# Patient Record
Sex: Female | Born: 1990
Health system: Southern US, Community
[De-identification: ages and names within clinical notes are randomized; demographics above are authoritative.]

## PROBLEM LIST (undated history)

## (undated) DIAGNOSIS — F32A Depression, unspecified: Secondary | ICD-10-CM

## (undated) DIAGNOSIS — I10 Essential (primary) hypertension: Secondary | ICD-10-CM

## (undated) DIAGNOSIS — F419 Anxiety disorder, unspecified: Secondary | ICD-10-CM

## (undated) HISTORY — DX: Essential (primary) hypertension: I10

## (undated) HISTORY — DX: Anxiety disorder, unspecified: F41.9

## (undated) HISTORY — DX: Depression, unspecified: F32.A

---
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
## 2018-03-31 DIAGNOSIS — F418 Other specified anxiety disorders: Secondary | ICD-10-CM | POA: Insufficient documentation

## 2019-12-06 ENCOUNTER — Emergency Department (HOSPITAL_COMMUNITY)
Admission: EM | Admit: 2019-12-06 | Discharge: 2019-12-06 | Disposition: A | Discharge status: Home / Self Care | Payer: Medicaid - Out of State | Attending: Emergency Medicine | Admitting: Emergency Medicine

## 2019-12-06 ENCOUNTER — Encounter (HOSPITAL_COMMUNITY): Payer: Self-pay | Admitting: Emergency Medicine

## 2019-12-06 ENCOUNTER — Other Ambulatory Visit: Payer: Self-pay

## 2019-12-06 ENCOUNTER — Emergency Department (HOSPITAL_COMMUNITY): Payer: Medicaid - Out of State

## 2019-12-06 DIAGNOSIS — R1013 Epigastric pain: Secondary | ICD-10-CM | POA: Diagnosis not present

## 2019-12-06 DIAGNOSIS — R1110 Vomiting, unspecified: Secondary | ICD-10-CM | POA: Insufficient documentation

## 2019-12-06 LAB — COMPREHENSIVE METABOLIC PANEL
ALT: 27 U/L (ref 0–44)
AST: 40 U/L (ref 15–41)
Albumin: 3.8 g/dL (ref 3.5–5.0)
Alkaline Phosphatase: 58 U/L (ref 38–126)
Anion gap: 11 (ref 5–15)
BUN: 14 mg/dL (ref 6–20)
CO2: 24 mmol/L (ref 22–32)
Calcium: 9 mg/dL (ref 8.9–10.3)
Chloride: 102 mmol/L (ref 98–111)
Creatinine, Ser: 0.92 mg/dL (ref 0.44–1.00)
GFR calc Af Amer: >60 mL/min (ref >60)
GFR calc non Af Amer: >60 mL/min (ref >60)
Glucose, Bld: 104 mg/dL — ABNORMAL HIGH (ref 70–99)
Potassium: 3.7 mmol/L (ref 3.5–5.1)
Sodium: 137 mmol/L (ref 135–145)
Total Bilirubin: 0.8 mg/dL (ref 0.3–1.2)
Total Protein: 6.8 g/dL (ref 6.5–8.1)

## 2019-12-06 LAB — URINALYSIS, ROUTINE W REFLEX MICROSCOPIC
Bilirubin Urine: NEGATIVE
Glucose, UA: NEGATIVE mg/dL
Hgb urine dipstick: NEGATIVE
Ketones, ur: NEGATIVE mg/dL
Leukocytes,Ua: NEGATIVE
Nitrite: NEGATIVE
Protein, ur: NEGATIVE mg/dL
Specific Gravity, Urine: 1.038 — ABNORMAL HIGH (ref 1.005–1.030)
pH: 7 (ref 5.0–8.0)

## 2019-12-06 LAB — CBC WITH DIFFERENTIAL/PLATELET
Abs Immature Granulocytes: 0.04 10*3/uL (ref 0.00–0.07)
Basophils Absolute: 0 10*3/uL (ref 0.0–0.1)
Basophils Relative: 0 %
Eosinophils Absolute: 0.1 10*3/uL (ref 0.0–0.5)
Eosinophils Relative: 1 %
HCT: 43.3 % (ref 36.0–46.0)
Hemoglobin: 14 g/dL (ref 12.0–15.0)
Immature Granulocytes: 0 %
Lymphocytes Relative: 22 %
Lymphs Abs: 2.2 10*3/uL (ref 0.7–4.0)
MCH: 30.4 pg (ref 26.0–34.0)
MCHC: 32.3 g/dL (ref 30.0–36.0)
MCV: 93.9 fL (ref 80.0–100.0)
Monocytes Absolute: 0.7 10*3/uL (ref 0.1–1.0)
Monocytes Relative: 7 %
Neutro Abs: 7 10*3/uL (ref 1.7–7.7)
Neutrophils Relative %: 70 %
Platelets: 260 10*3/uL (ref 150–400)
RBC: 4.61 MIL/uL (ref 3.87–5.11)
RDW: 13 % (ref 11.5–15.5)
WBC: 9.9 10*3/uL (ref 4.0–10.5)
nRBC: 0 % (ref 0.0–0.2)

## 2019-12-06 LAB — LIPASE, BLOOD: Lipase: 23 U/L (ref 11–51)

## 2019-12-06 LAB — HCG, QUANTITATIVE, PREGNANCY: hCG, Beta Chain, Quant, S: 1 m[IU]/mL (ref <5)

## 2019-12-06 MED ORDER — FENTANYL CITRATE (PF) 100 MCG/2ML IJ SOLN
50.0000 ug | Freq: Once | INTRAMUSCULAR | Status: AC
  Administered 2019-12-06: 50 ug via INTRAVENOUS
  Filled 2019-12-06: qty 2

## 2019-12-06 MED ORDER — FAMOTIDINE 20 MG PO TABS
20.0000 mg | ORAL_TABLET | Freq: Once | ORAL | Status: AC
  Administered 2019-12-06: 20 mg via ORAL
  Filled 2019-12-06: qty 1

## 2019-12-06 MED ORDER — LIDOCAINE VISCOUS HCL 2 % MT SOLN
15.0000 mL | Freq: Once | OROMUCOSAL | Status: AC
  Administered 2019-12-06: 15 mL via ORAL
  Filled 2019-12-06: qty 15

## 2019-12-06 MED ORDER — PANTOPRAZOLE SODIUM 20 MG PO TBEC: 20.0000 mg | DELAYED_RELEASE_TABLET | Freq: Every day | ORAL | 0 refills | Status: DC

## 2019-12-06 MED ORDER — FAMOTIDINE 20 MG PO TABS: 20.0000 mg | ORAL_TABLET | Freq: Two times a day (BID) | ORAL | 0 refills | Status: DC

## 2019-12-06 MED ORDER — ALUM & MAG HYDROXIDE-SIMETH 200-200-20 MG/5ML PO SUSP
30.0000 mL | Freq: Once | ORAL | Status: AC
  Administered 2019-12-06: 30 mL via ORAL
  Filled 2019-12-06: qty 30

## 2019-12-06 MED ORDER — LACTATED RINGERS IV BOLUS
1000.0000 mL | Freq: Once | INTRAVENOUS | Status: AC
  Administered 2019-12-06: 1000 mL via INTRAVENOUS

## 2019-12-06 MED ORDER — IOHEXOL 300 MG/ML  SOLN
100.0000 mL | Freq: Once | INTRAMUSCULAR | Status: AC
  Administered 2019-12-06: 100 mL via INTRAVENOUS

## 2019-12-06 MED ORDER — LIDOCAINE VISCOUS HCL 2 % MT SOLN: 15.0000 mL | OROMUCOSAL | 0 refills | Status: DC | PRN

## 2019-12-06 MED ORDER — PANTOPRAZOLE SODIUM 40 MG PO TBEC
80.0000 mg | DELAYED_RELEASE_TABLET | Freq: Once | ORAL | Status: AC
  Administered 2019-12-06: 80 mg via ORAL
  Filled 2019-12-06: qty 2

## 2019-12-06 NOTE — ED Provider Notes (Signed)
[facility] EMERGENCY DEPARTMENT Provider Note   CSN: [id number] Arrival date & time: 12/06/19  0404     History Chief Complaint  Patient presents with  . Abdominal Pain    Lindsay Sanders is a 29 y.o. female.   Abdominal Pain Pain location:  Epigastric Pain quality: aching, gnawing and sharp   Pain radiates to:  Back Pain severity:  Mild Onset quality:  Sudden Duration:  2 hours Timing:  Intermittent Progression:  Waxing and waning Chronicity:  New Context: not alcohol use   Relieved by:  None tried Worsened by:  Nothing Ineffective treatments:  None tried Associated symptoms: vomiting        History reviewed. No pertinent past medical history.  There are no problems to display for this patient.   History reviewed. No pertinent surgical history.   OB History   No obstetric history on file.     No family history on file.  Social History   Tobacco Use  . Smoking status: Never Smoker  . Smokeless tobacco: Never Used  Substance Use Topics  . Alcohol use: Never  . Drug use: Never    Home Medications Prior to Admission medications   Medication Sig Start Date End Date Taking? Authorizing Provider  famotidine (PEPCID) 20 MG tablet Take 1 tablet (20 mg total) by mouth 2 (two) times daily. 12/06/19   Vetta Couzens, Barbara Cower, MD  lidocaine (XYLOCAINE) 2 % solution Use as directed 15 mLs in the mouth or throat every 4 (four) hours as needed for mouth pain. 12/06/19   Graciano Batson, Barbara Cower, MD  pantoprazole (PROTONIX) 20 MG tablet Take 1 tablet (20 mg total) by mouth daily. 12/06/19   Taysia Rivere, Barbara Cower, MD    Allergies    Lexapro [escitalopram]  Review of Systems   Review of Systems  Gastrointestinal: Positive for abdominal pain and vomiting.  All other systems reviewed and are negative.   Physical Exam Updated Vital Signs BP 123/72 (BP Location: Right Arm)   Pulse 87   Temp 98.3 F (36.8 C) (Oral)   Resp 18   Ht 5\' 8"  (1.727 m)   Wt 120 kg   SpO2 97%    BMI 40.22 kg/m   Physical Exam Vitals and nursing note reviewed.  Constitutional:      Appearance: She is well-developed.  HENT:     Head: Normocephalic and atraumatic.     Nose: No congestion.     Mouth/Throat:     Mouth: Mucous membranes are moist.     Pharynx: Oropharynx is clear.  Eyes:     Conjunctiva/sclera: Conjunctivae normal.     Pupils: Pupils are equal, round, and reactive to light.  Cardiovascular:     Rate and Rhythm: Normal rate and regular rhythm.  Pulmonary:     Effort: Pulmonary effort is normal. No respiratory distress.     Breath sounds: No stridor.  Abdominal:     General: There is no distension.  Musculoskeletal:        General: No swelling. Normal range of motion.     Cervical back: Normal range of motion.  Skin:    General: Skin is warm and dry.  Neurological:     General: No focal deficit present.     Mental Status: She is alert.     ED Results / Procedures / Treatments   Labs (all labs ordered are listed, but only abnormal results are displayed) Labs Reviewed  COMPREHENSIVE METABOLIC PANEL - Abnormal; Notable for the following components:  Result Value   Glucose, Bld 104 (*)    All other components within normal limits  CBC WITH DIFFERENTIAL/PLATELET  LIPASE, BLOOD  HCG, QUANTITATIVE, PREGNANCY  URINALYSIS, ROUTINE W REFLEX MICROSCOPIC    EKG None  Radiology No results found.  Procedures Procedures (including critical care time)  Medications Ordered in ED Medications  pantoprazole (PROTONIX) EC tablet 80 mg (has no administration in time range)  famotidine (PEPCID) tablet 20 mg (has no administration in time range)  fentaNYL (SUBLIMAZE) injection 50 mcg (50 mcg Intravenous Given 12/06/19 0507)  lactated ringers bolus 1,000 mL (0 mLs Intravenous Stopped 12/06/19 0552)  alum & mag hydroxide-simeth (MAALOX/MYLANTA) 200-200-20 MG/5ML suspension 30 mL (30 mLs Oral Given 12/06/19 0601)    And  lidocaine (XYLOCAINE) 2 % viscous mouth  solution 15 mL (15 mLs Oral Given 12/06/19 0601)  iohexol (OMNIPAQUE) 300 MG/ML solution 100 mL (100 mLs Intravenous Contrast Given 12/06/19 0715)    ED Course  I have reviewed the triage vital signs and the nursing notes.  Pertinent labs & imaging results that were available during my care of the patient were reviewed by me and considered in my medical decision making (see chart for details).    MDM Rules/Calculators/A&P                      Epigastric pain. eval for pancreatitis, cholecystitis, cardiac causes.   Labs ok, will proceed with CT to rule out emergencies such as obstruction, colitis, enteritis or perforation.   Pain improved mostly with lidocaine. Fentanyl helped minimally, pending ct read, has no large abnormality on my perusal.   Ct ok. Stable for discharge with pcp follow up.   Final Clinical Impression(s) / ED Diagnoses Final diagnoses:  Epigastric pain    Rx / DC Orders ED Discharge Orders         Ordered    lidocaine (XYLOCAINE) 2 % solution  Every 4 hours PRN     12/06/19 0725    pantoprazole (PROTONIX) 20 MG tablet  Daily     12/06/19 0725    famotidine (PEPCID) 20 MG tablet  2 times daily     12/06/19 0725           Kellsie Grindle, Corene Cornea, MD 12/06/19 [phone]

## 2019-12-06 NOTE — ED Triage Notes (Signed)
Patient reports mid abdominal pain with emesis this morning , no diarrhea , denies fever or chills , patient added mild low back pain .

## 2019-12-06 NOTE — ED Notes (Signed)
Pt ambulated to restroom with steady gait and tolerated well. Able to give UA. Pt currently back in bed on monitors resting comfortably.

## 2019-12-11 ENCOUNTER — Telehealth: Payer: Self-pay | Admitting: Internal Medicine

## 2019-12-11 NOTE — Telephone Encounter (Signed)
[facility] REFERRAL

## 2019-12-12 NOTE — Telephone Encounter (Signed)
Ok to schedule ov.  

## 2019-12-12 NOTE — Telephone Encounter (Signed)
CALLED PATIENT AND L/M FOR HER TO CALL ME.  LOOKS LIKE WE MAY NOT TAKE HER INSURANCE

## 2020-01-02 ENCOUNTER — Ambulatory Visit: Payer: Medicaid - Out of State | Admitting: Gastroenterology

## 2020-02-01 DIAGNOSIS — F418 Other specified anxiety disorders: Secondary | ICD-10-CM | POA: Diagnosis not present

## 2020-05-16 DIAGNOSIS — Z23 Encounter for immunization: Secondary | ICD-10-CM | POA: Diagnosis not present

## 2020-05-16 DIAGNOSIS — Z6841 Body Mass Index (BMI) 40.0 and over, adult: Secondary | ICD-10-CM | POA: Diagnosis not present

## 2020-05-16 DIAGNOSIS — F418 Other specified anxiety disorders: Secondary | ICD-10-CM | POA: Diagnosis not present

## 2020-05-21 ENCOUNTER — Ambulatory Visit: Payer: Medicaid - Out of State

## 2020-05-31 ENCOUNTER — Other Ambulatory Visit: Payer: Self-pay | Admitting: Family Medicine

## 2020-06-13 DIAGNOSIS — L293 Anogenital pruritus, unspecified: Secondary | ICD-10-CM | POA: Diagnosis not present

## 2020-06-13 DIAGNOSIS — N760 Acute vaginitis: Secondary | ICD-10-CM | POA: Diagnosis not present

## 2020-06-13 DIAGNOSIS — Z113 Encounter for screening for infections with a predominantly sexual mode of transmission: Secondary | ICD-10-CM | POA: Diagnosis not present

## 2020-06-13 DIAGNOSIS — R350 Frequency of micturition: Secondary | ICD-10-CM | POA: Diagnosis not present

## 2020-07-18 DIAGNOSIS — Z30432 Encounter for removal of intrauterine contraceptive device: Secondary | ICD-10-CM | POA: Diagnosis not present

## 2020-07-18 DIAGNOSIS — Z130 Encounter for screening for diseases of the blood and blood-forming organs and certain disorders involving the immune mechanism: Secondary | ICD-10-CM | POA: Diagnosis not present

## 2020-07-18 DIAGNOSIS — Z3009 Encounter for other general counseling and advice on contraception: Secondary | ICD-10-CM | POA: Diagnosis not present

## 2020-07-18 DIAGNOSIS — Z1389 Encounter for screening for other disorder: Secondary | ICD-10-CM | POA: Diagnosis not present

## 2020-07-18 DIAGNOSIS — Z6838 Body mass index (BMI) 38.0-38.9, adult: Secondary | ICD-10-CM | POA: Diagnosis not present

## 2020-07-18 DIAGNOSIS — Z01419 Encounter for gynecological examination (general) (routine) without abnormal findings: Secondary | ICD-10-CM | POA: Diagnosis not present

## 2020-07-18 DIAGNOSIS — R3915 Urgency of urination: Secondary | ICD-10-CM | POA: Diagnosis not present

## 2021-01-27 DIAGNOSIS — L03116 Cellulitis of left lower limb: Secondary | ICD-10-CM | POA: Diagnosis not present

## 2021-01-27 DIAGNOSIS — Z6841 Body Mass Index (BMI) 40.0 and over, adult: Secondary | ICD-10-CM | POA: Diagnosis not present

## 2021-01-29 DIAGNOSIS — Z1331 Encounter for screening for depression: Secondary | ICD-10-CM | POA: Diagnosis not present

## 2021-01-29 DIAGNOSIS — L0390 Cellulitis, unspecified: Secondary | ICD-10-CM | POA: Diagnosis not present

## 2021-02-03 DIAGNOSIS — W57XXXA Bitten or stung by nonvenomous insect and other nonvenomous arthropods, initial encounter: Secondary | ICD-10-CM | POA: Diagnosis not present

## 2021-02-03 DIAGNOSIS — L0390 Cellulitis, unspecified: Secondary | ICD-10-CM | POA: Insufficient documentation

## 2021-02-03 DIAGNOSIS — S70362A Insect bite (nonvenomous), left thigh, initial encounter: Secondary | ICD-10-CM | POA: Diagnosis not present

## 2021-02-03 DIAGNOSIS — T148XXS Other injury of unspecified body region, sequela: Secondary | ICD-10-CM | POA: Diagnosis not present

## 2021-04-29 ENCOUNTER — Other Ambulatory Visit: Payer: Self-pay

## 2021-04-29 ENCOUNTER — Encounter: Payer: Self-pay | Admitting: Behavioral Health

## 2021-04-29 ENCOUNTER — Ambulatory Visit (INDEPENDENT_AMBULATORY_CARE_PROVIDER_SITE_OTHER): Payer: 59 | Admitting: Behavioral Health

## 2021-04-29 VITALS — BP 142/92 | HR 77 | Ht 68.0 in | Wt 235.0 lb

## 2021-04-29 DIAGNOSIS — F331 Major depressive disorder, recurrent, moderate: Secondary | ICD-10-CM | POA: Diagnosis not present

## 2021-04-29 DIAGNOSIS — F411 Generalized anxiety disorder: Secondary | ICD-10-CM | POA: Diagnosis not present

## 2021-04-29 MED ORDER — VILAZODONE HCL 20 MG PO TABS
20.0000 mg | ORAL_TABLET | Freq: Every day | ORAL | 1 refills | Status: DC
  Filled 2021-04-29: qty 30, 30d supply, fill #0

## 2021-04-29 NOTE — Progress Notes (Signed)
[facility] MD/PA/NP Initial Note  04/29/2021 2:33 PM Lindsay Sanders  MRN:  [id number]  Chief Complaint:   HPI:  30 year old presents to this office for initial visit and to establish care. She says that she has been struggling with anxiety and depression since after the birth of her first child at age 27. She says that she has tried multiple SSRI's but nothing has really helped or had side effects . Says that she recently tried Trintellix and it seemed to be working well but she developed migraines from the medication. Says that she realized that her symptoms were getting worse and it started to effect her well being. She said also is still feeling the affects of recent break up with boyfriend in May, 2022. She says that she finds herself going to bars more and drinking a little more than she used to. She says she is more irritable, has racing, thoughts, and spending more money than she should. Says her anxiety today is 6/10 and depression is 7/10. She says she is only sleeping about 5 hours per night but hope it will improved if she can feel better. No mania. No psychosis. No SI, HI.  Past psychiatric medication trials: Wellbutrin: agitation Zoloft: says did not help depression Trintellix: migraines Lexapro: hallucinations, listed allergy Prozac:  ukn.  Visit Diagnosis:    ICD-10-CM   1. Generalized anxiety disorder  F41.1 Vilazodone HCl (VIIBRYD) 20 MG TABS    2. Major depressive disorder, recurrent episode, moderate (HCC)  F33.1 Vilazodone HCl (VIIBRYD) 20 MG TABS      Past Psychiatric History:   Past Medical History: No past medical history on file. No past surgical history on file.  Family Psychiatric History: none noted  Family History: No family history on file.  Social History:  Social History   Socioeconomic History   Marital status: Single    Spouse name: Not on file   Number of children: 1   Years of education: 16   Highest education level: Associate degree:  occupational, Hotel manager, or vocational program  Occupational History   Not on file  Tobacco Use   Smoking status: Never   Smokeless tobacco: Never  Substance and Sexual Activity   Alcohol use: Never    Comment: 10 per week   Drug use: Never   Sexual activity: Not on file  Other Topics Concern   Not on file  Social History Narrative   Lives alone with son. Likes to go to pool. Go to beach when possible.    Social Determinants of Health   Financial Resource Strain: Not on file  Food Insecurity: Not on file  Transportation Needs: Not on file  Physical Activity: Not on file  Stress: Not on file  Social Connections: Not on file    Allergies:  Allergies  Allergen Reactions   Lexapro [Escitalopram]     Metabolic Disorder Labs: No results found for: HGBA1C, MPG No results found for: PROLACTIN No results found for: CHOL, TRIG, HDL, CHOLHDL, VLDL, LDLCALC No results found for: TSH  Therapeutic Level Labs: No results found for: LITHIUM No results found for: VALPROATE No components found for:  CBMZ  Current Medications: Current Outpatient Medications  Medication Sig Dispense Refill   Vilazodone HCl (VIIBRYD) 20 MG TABS Take 1 tablet (20 mg total) by mouth daily. 30 tablet 1   famotidine (PEPCID) 20 MG tablet Take 1 tablet (20 mg total) by mouth 2 (two) times daily. (Patient not taking: Reported on 04/29/2021) 10 tablet 0  lidocaine (XYLOCAINE) 2 % solution Use as directed 15 mLs in the mouth or throat every 4 (four) hours as needed for mouth pain. (Patient not taking: Reported on 04/29/2021) 300 mL 0   pantoprazole (PROTONIX) 20 MG tablet Take 1 tablet (20 mg total) by mouth daily. (Patient not taking: Reported on 04/29/2021) 14 tablet 0   vortioxetine HBr (TRINTELLIX) 10 MG TABS tablet TAKE 1 TABLET BY MOUTH ONCE DAILY (Patient not taking: Reported on 04/29/2021) 90 tablet 3   No current facility-administered medications for this visit.    Medication Side Effects:  none  Orders placed this visit:  No orders of the defined types were placed in this encounter.   Psychiatric Specialty Exam:  Review of Systems  Constitutional:  Positive for fatigue and unexpected weight change.  Cardiovascular:  Positive for chest pain.  Allergic/Immunologic: Negative.   Neurological: Negative.   Psychiatric/Behavioral:  Positive for decreased concentration and dysphoric mood. The patient is nervous/anxious.    There were no vitals taken for this visit.There is no height or weight on file to calculate BMI.  General Appearance: Casual and Neat  Eye Contact:  Good  Speech:  Clear and Coherent  Volume:  Normal  Mood:  Anxious and Depressed  Affect:  Depressed and Tearful  Thought Process:  Coherent  Orientation:  Full (Time, Place, and Person)  Thought Content: NA   Suicidal Thoughts:  No  Homicidal Thoughts:  No  Memory:  WNL  Judgement:  Good  Insight:  Good  Psychomotor Activity:  Normal  Concentration:  Concentration: Good  Recall:  Good  Fund of Knowledge: Good  Language: Good  Assets:  Desire for Improvement  ADL's:  Intact  Cognition: WNL  Prognosis:  Good   Screenings:  PHQ2-9    [place] [facility] Visit from 04/29/2021 in [facility]  PHQ-2 Total Score 5  PHQ-9 Total Score 15       Receiving Psychotherapy: No   Treatment Plan/Recommendations:  To start Viibryd 10 mg for 7 days, then 20 mg daily. Provided one Starter kit 14 day supply sample Will report worsening symptoms or side effects promptly To follow up in 3 weeks to reassess. Provided emergency contact information Greater than 50% of  45 min face to face time with patient was spent on counseling and coordination of care. We discussed long history of anxiety and depression. Discussed medication trials and why they did not work. Discussed risk of SSRI's and pregnancy. Pt is not currently on BC. Recommended psychotherapy.     Elwanda Brooklyn, NP

## 2021-04-30 ENCOUNTER — Other Ambulatory Visit: Payer: Self-pay

## 2021-05-13 ENCOUNTER — Other Ambulatory Visit: Payer: Self-pay

## 2021-05-17 DIAGNOSIS — Z20822 Contact with and (suspected) exposure to covid-19: Secondary | ICD-10-CM | POA: Diagnosis not present

## 2021-05-17 DIAGNOSIS — Z3202 Encounter for pregnancy test, result negative: Secondary | ICD-10-CM | POA: Diagnosis not present

## 2021-05-17 DIAGNOSIS — R300 Dysuria: Secondary | ICD-10-CM | POA: Diagnosis not present

## 2021-05-20 ENCOUNTER — Ambulatory Visit: Payer: 59 | Admitting: Behavioral Health

## 2021-05-22 ENCOUNTER — Ambulatory Visit: Payer: 59 | Admitting: Psychiatry

## 2021-05-27 ENCOUNTER — Ambulatory Visit: Payer: 59 | Admitting: Behavioral Health

## 2021-05-28 ENCOUNTER — Ambulatory Visit: Payer: 59 | Admitting: Psychiatry

## 2021-07-21 DIAGNOSIS — Z202 Contact with and (suspected) exposure to infections with a predominantly sexual mode of transmission: Secondary | ICD-10-CM | POA: Diagnosis not present

## 2021-07-21 DIAGNOSIS — Z01419 Encounter for gynecological examination (general) (routine) without abnormal findings: Secondary | ICD-10-CM | POA: Diagnosis not present

## 2021-07-21 DIAGNOSIS — Z113 Encounter for screening for infections with a predominantly sexual mode of transmission: Secondary | ICD-10-CM | POA: Diagnosis not present

## 2021-07-21 DIAGNOSIS — Z6834 Body mass index (BMI) 34.0-34.9, adult: Secondary | ICD-10-CM | POA: Diagnosis not present

## 2021-07-21 DIAGNOSIS — Z7689 Persons encountering health services in other specified circumstances: Secondary | ICD-10-CM | POA: Diagnosis not present

## 2021-07-21 DIAGNOSIS — Z3040 Encounter for surveillance of contraceptives, unspecified: Secondary | ICD-10-CM | POA: Diagnosis not present

## 2021-07-21 DIAGNOSIS — Z3202 Encounter for pregnancy test, result negative: Secondary | ICD-10-CM | POA: Diagnosis not present

## 2021-07-21 DIAGNOSIS — Z8742 Personal history of other diseases of the female genital tract: Secondary | ICD-10-CM | POA: Diagnosis not present

## 2021-07-21 DIAGNOSIS — Z1389 Encounter for screening for other disorder: Secondary | ICD-10-CM | POA: Diagnosis not present

## 2021-07-21 DIAGNOSIS — Z130 Encounter for screening for diseases of the blood and blood-forming organs and certain disorders involving the immune mechanism: Secondary | ICD-10-CM | POA: Diagnosis not present

## 2021-07-22 ENCOUNTER — Other Ambulatory Visit: Payer: Self-pay

## 2021-09-18 IMAGING — CT CT ABD-PELV W/ CM
2 of 4 series · 16 of 46 positions shown, 18 images · IV contrast (APPLIED)
Comparison: None.

CLINICAL DATA: Sudden onset of severe mid abdominal pain with
nausea and vomiting since this morning.

EXAM:
CT ABDOMEN AND PELVIS WITH CONTRAST
TECHNIQUE: Multidetector CT imaging of the abdomen and pelvis was performed
using the standard protocol following bolus administration of
intravenous contrast.
CONTRAST:  <See Chart> OMNIPAQUE IOHEXOL 300 MG/ML  SOLN

[Series 3: abd/ pelvis 5.0 i30f 2 · axial · 0.92mm/px · z∈[+773,+1303]mm · 13 of 118 slices shown, 15 images]
[im 6/118  soft-tissue]
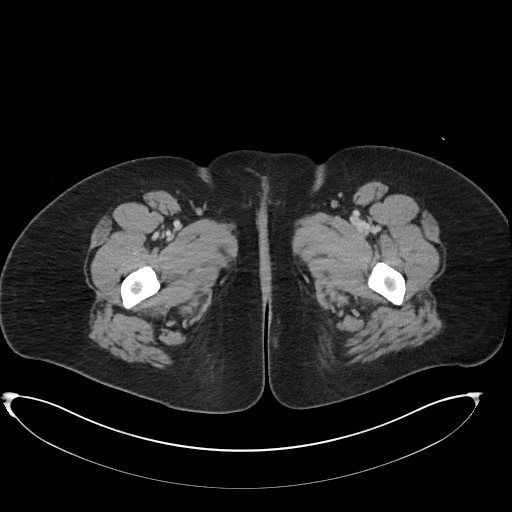
[im 6/118  bone]
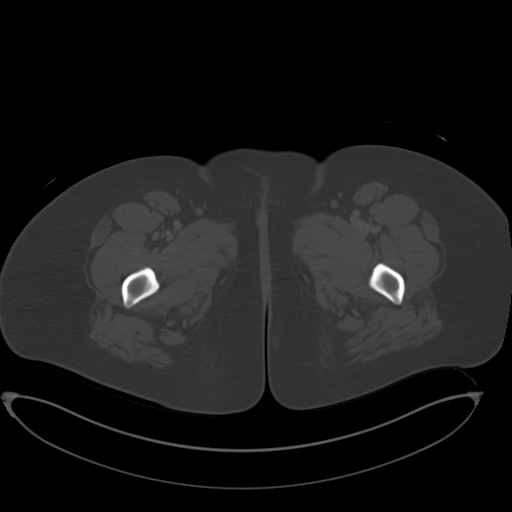
[im 16/118  soft-tissue]
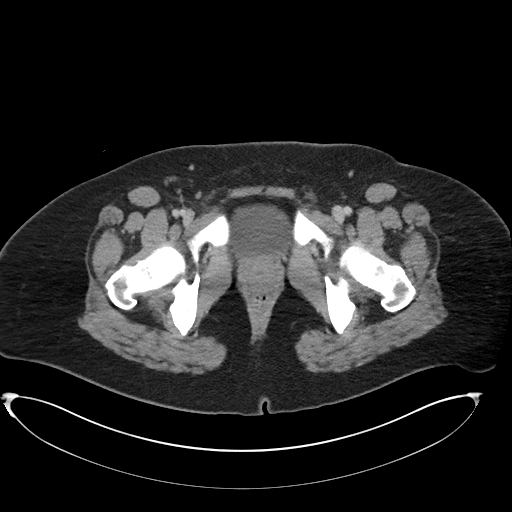
[im 26/118  soft-tissue]
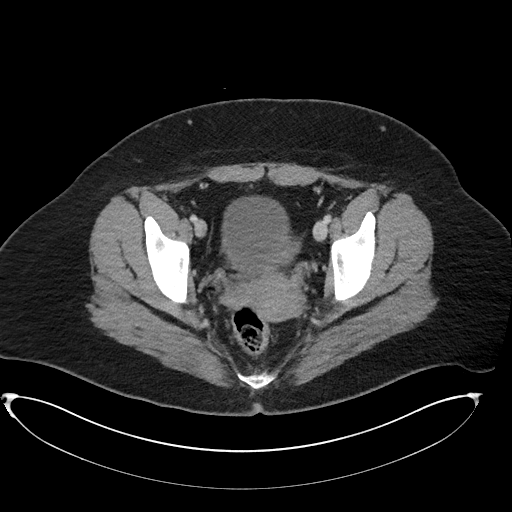
[im 31/118  soft-tissue]
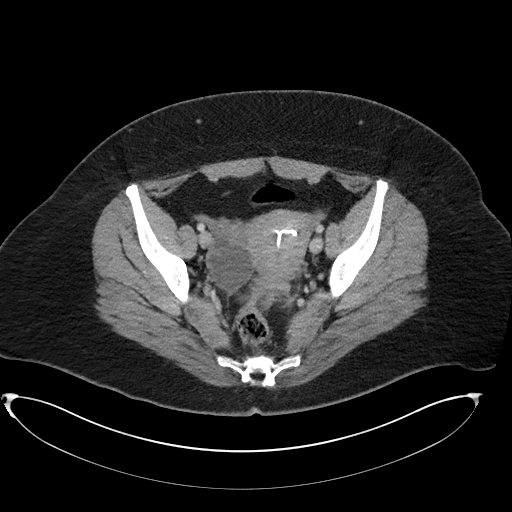
[im 41/118  soft-tissue]
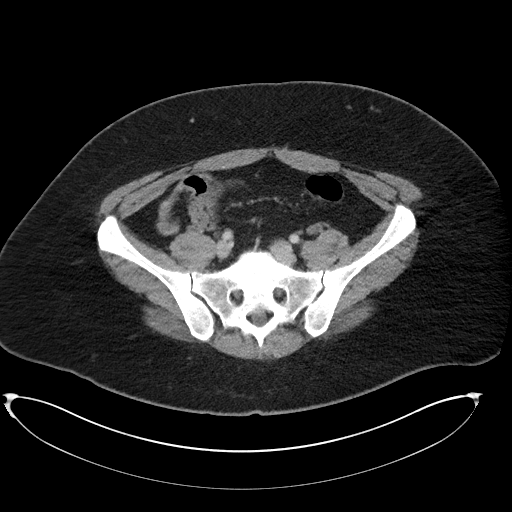
[im 51/118  soft-tissue]
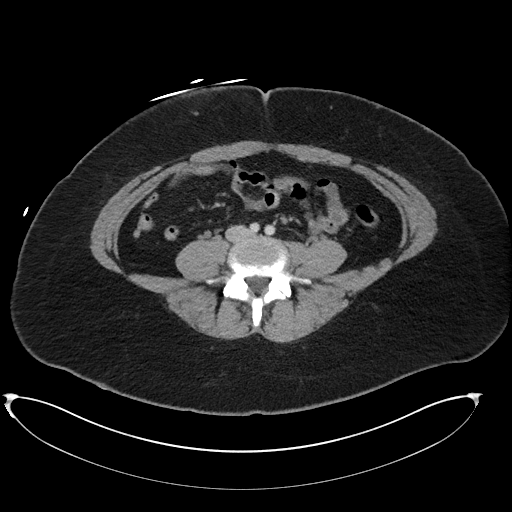
[im 62/118  soft-tissue]
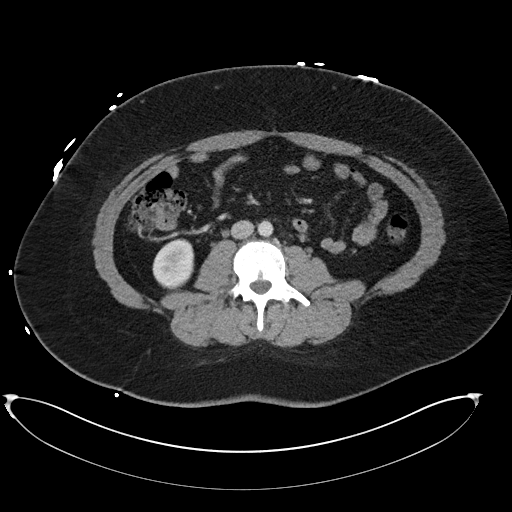
[im 67/118  soft-tissue]
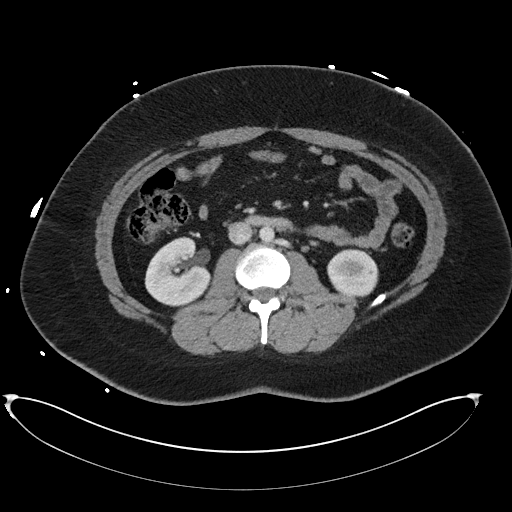
[im 77/118  soft-tissue]
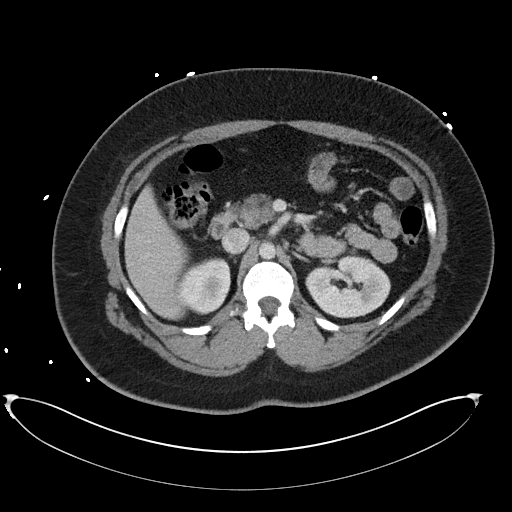
[im 77/118  bone]
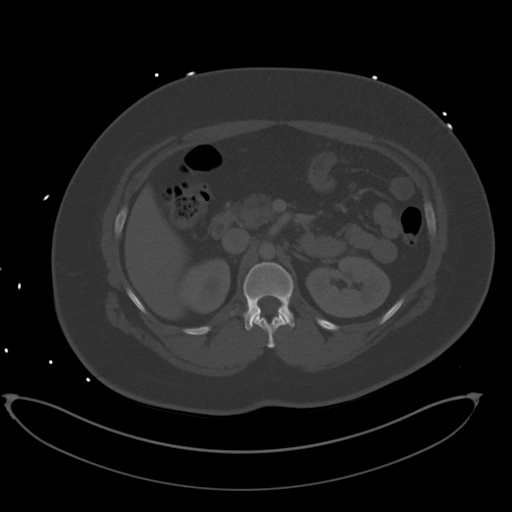
[im 87/118  soft-tissue]
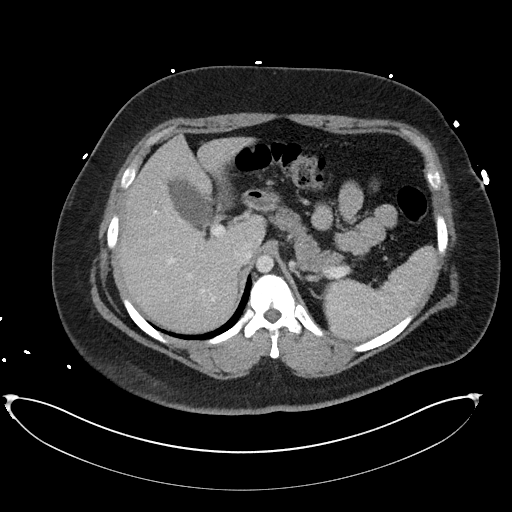
[im 92/118  soft-tissue]
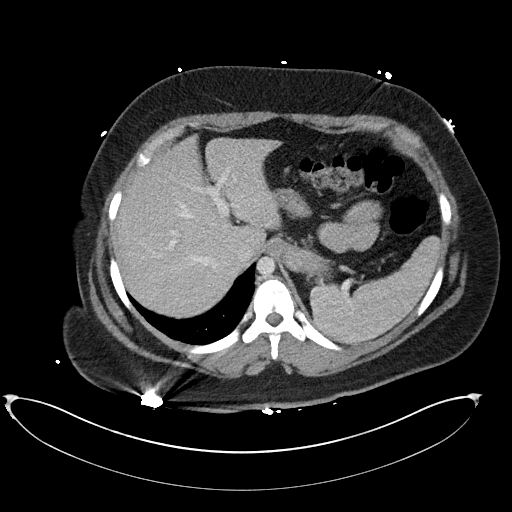
[im 102/118  soft-tissue]
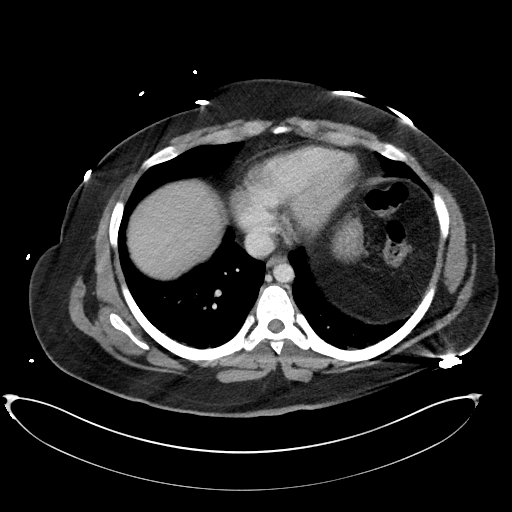
[im 112/118  soft-tissue]
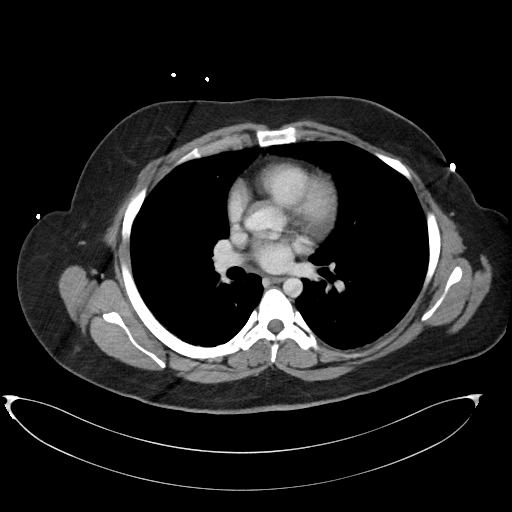

[Series 6: coronal soft tissue · coronal · 0.97mm/px · 3 of 127 slices shown]
[im 43/127  soft-tissue]
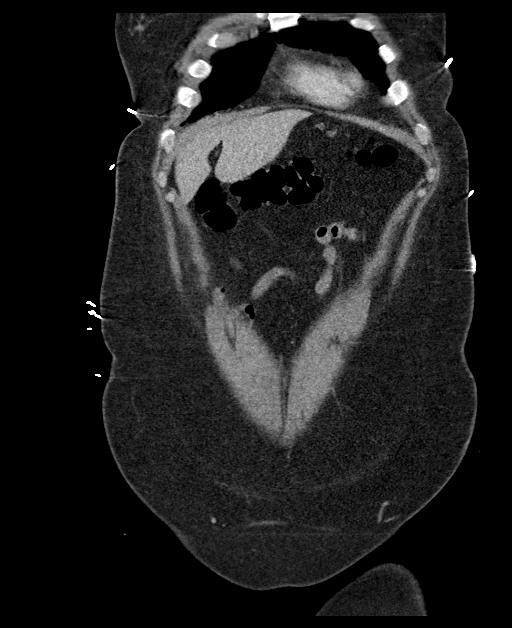
[im 57/127  soft-tissue]
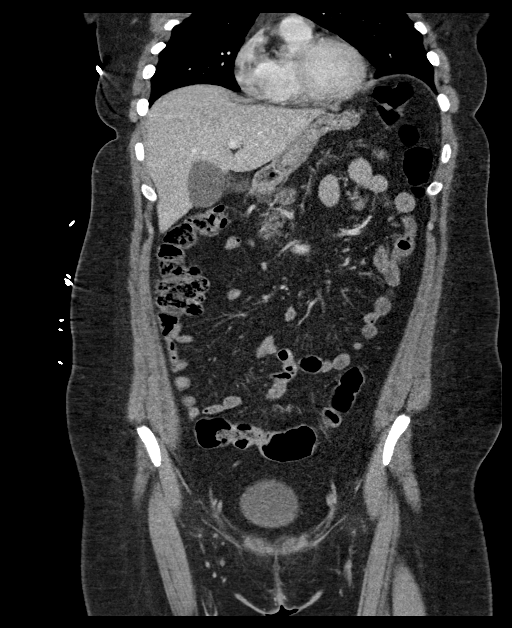
[im 71/127  soft-tissue]
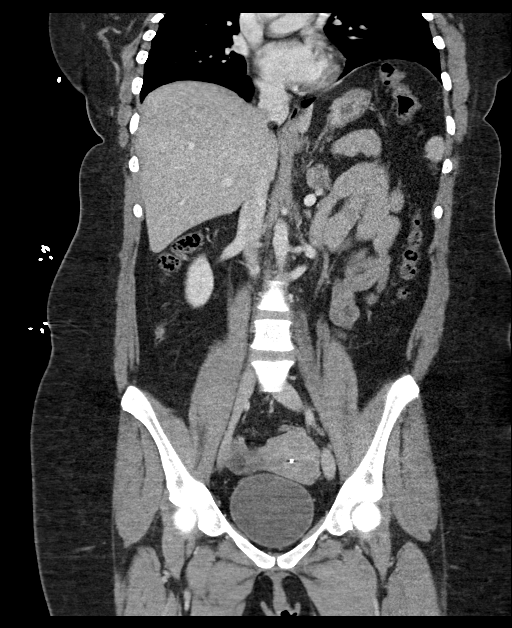

[16 of 46 positions shown; findings below may reference images not displayed]

FINDINGS: Lower chest: Minimal streaky dependent bibasilar atelectasis but no
infiltrates or effusions. The heart is normal in size. No
pericardial effusion. The distal esophagus is grossly normal.

Hepatobiliary: No focal hepatic lesions or intrahepatic biliary
dilatation. The gallbladder is normal. No common bile duct
dilatation.

Pancreas: No mass, inflammation or ductal dilatation.

Spleen: Normal size. No focal lesions.

Adrenals/Urinary Tract: The adrenal glands are normal. No renal,
ureteral or bladder calculi or mass. No findings for pyelonephritis.
Bladder is unremarkable.

Stomach/Bowel: The stomach, duodenum, small bowel and colon are
unremarkable. No acute inflammatory changes, mass lesions or
obstructive findings. The terminal ileum is normal. The appendix is
normal.

Vascular/Lymphatic: The aorta is normal in caliber. No dissection.
The branch vessels are patent. The major venous structures are
patent. Circumaortic left renal vein is noted. No mesenteric or
retroperitoneal mass or adenopathy. Small scattered lymph nodes are
noted.

Reproductive: The uterus is unremarkable. An IUD is noted in the
endometrial canal. There is a simple appearing 4 cm cyst associated
with the right ovary. The left ovary appears normal.

Other: No pelvic mass or adenopathy. No free pelvic fluid
collections. No inguinal mass or adenopathy. No abdominal wall
hernia or subcutaneous lesions.

Musculoskeletal: No significant bony findings.
IMPRESSION: 1. No acute abdominal/pelvic findings, mass lesions or adenopathy.
2. 4 cm right ovarian cyst.

## 2021-10-08 DIAGNOSIS — Z1159 Encounter for screening for other viral diseases: Secondary | ICD-10-CM | POA: Diagnosis not present

## 2021-10-08 DIAGNOSIS — Z23 Encounter for immunization: Secondary | ICD-10-CM | POA: Diagnosis not present

## 2021-10-08 DIAGNOSIS — L02234 Carbuncle of groin: Secondary | ICD-10-CM | POA: Diagnosis not present

## 2021-10-08 DIAGNOSIS — Z79899 Other long term (current) drug therapy: Secondary | ICD-10-CM | POA: Diagnosis not present

## 2021-10-08 DIAGNOSIS — B379 Candidiasis, unspecified: Secondary | ICD-10-CM | POA: Diagnosis not present

## 2021-10-08 DIAGNOSIS — Z203 Contact with and (suspected) exposure to rabies: Secondary | ICD-10-CM | POA: Diagnosis not present

## 2021-10-08 DIAGNOSIS — Z209 Contact with and (suspected) exposure to unspecified communicable disease: Secondary | ICD-10-CM | POA: Diagnosis not present

## 2021-10-08 DIAGNOSIS — R030 Elevated blood-pressure reading, without diagnosis of hypertension: Secondary | ICD-10-CM | POA: Diagnosis not present

## 2021-10-08 DIAGNOSIS — Z1152 Encounter for screening for COVID-19: Secondary | ICD-10-CM | POA: Diagnosis not present

## 2021-10-08 DIAGNOSIS — Z2914 Encounter for prophylactic rabies immune globin: Secondary | ICD-10-CM | POA: Diagnosis not present

## 2021-10-11 DIAGNOSIS — Z203 Contact with and (suspected) exposure to rabies: Secondary | ICD-10-CM | POA: Diagnosis not present

## 2021-10-11 DIAGNOSIS — Z23 Encounter for immunization: Secondary | ICD-10-CM | POA: Diagnosis not present

## 2021-10-11 DIAGNOSIS — R109 Unspecified abdominal pain: Secondary | ICD-10-CM | POA: Diagnosis not present

## 2021-10-11 DIAGNOSIS — Z2914 Encounter for prophylactic rabies immune globin: Secondary | ICD-10-CM | POA: Diagnosis not present

## 2021-10-11 DIAGNOSIS — R519 Headache, unspecified: Secondary | ICD-10-CM | POA: Diagnosis not present

## 2021-10-15 DIAGNOSIS — Z23 Encounter for immunization: Secondary | ICD-10-CM | POA: Diagnosis not present

## 2021-10-15 DIAGNOSIS — Z2914 Encounter for prophylactic rabies immune globin: Secondary | ICD-10-CM | POA: Diagnosis not present

## 2021-10-15 DIAGNOSIS — Z203 Contact with and (suspected) exposure to rabies: Secondary | ICD-10-CM | POA: Diagnosis not present

## 2021-10-22 DIAGNOSIS — Z79899 Other long term (current) drug therapy: Secondary | ICD-10-CM | POA: Diagnosis not present

## 2021-10-22 DIAGNOSIS — Z203 Contact with and (suspected) exposure to rabies: Secondary | ICD-10-CM | POA: Diagnosis not present

## 2021-10-22 DIAGNOSIS — Z23 Encounter for immunization: Secondary | ICD-10-CM | POA: Diagnosis not present

## 2021-10-22 DIAGNOSIS — Z2914 Encounter for prophylactic rabies immune globin: Secondary | ICD-10-CM | POA: Diagnosis not present

## 2021-10-29 ENCOUNTER — Ambulatory Visit (INDEPENDENT_AMBULATORY_CARE_PROVIDER_SITE_OTHER): Payer: 59 | Admitting: Nurse Practitioner

## 2021-10-29 ENCOUNTER — Encounter: Payer: Self-pay | Admitting: Nurse Practitioner

## 2021-10-29 VITALS — BP 136/83 | HR 74 | Temp 98.2°F | Ht 68.0 in | Wt 249.0 lb

## 2021-10-29 DIAGNOSIS — F419 Anxiety disorder, unspecified: Secondary | ICD-10-CM | POA: Diagnosis not present

## 2021-10-29 DIAGNOSIS — F321 Major depressive disorder, single episode, moderate: Secondary | ICD-10-CM | POA: Diagnosis not present

## 2021-10-29 DIAGNOSIS — Z7689 Persons encountering health services in other specified circumstances: Secondary | ICD-10-CM | POA: Insufficient documentation

## 2021-10-29 NOTE — Patient Instructions (Signed)
Managing Anxiety, Adult After being diagnosed with anxiety, you may be relieved to know why you have felt or behaved a certain way. You may also feel overwhelmed about the treatment ahead and what it will mean for your life. With care and support, you can manage this condition. How to manage lifestyle changes Managing stress and anxiety Stress is your body's reaction to life changes and events, both good and bad. Most stress will last just a few hours, but stress can be ongoing and can lead to more than just stress. Although stress can play a major role in anxiety, it is not the same as anxiety. Stress is usually caused by something external, such as a deadline, test, or competition. Stress normally passes after the triggering event has ended.  Anxiety is caused by something internal, such as imagining a terrible outcome or worrying that something will go wrong that will devastate you. Anxiety often does not go away even after the triggering event is over, and it can become long-term (chronic) worry. It is important to understand the differences between stress and anxiety and to manage your stress effectively so that it does not lead to an anxious response. Talk with your health care provider or a counselor to learn more about reducing anxiety and stress. He or she may suggest tension reduction techniques, such as: Music therapy. Spend time creating or listening to music that you enjoy and that inspires you. Mindfulness-based meditation. Practice being aware of your normal breaths while not trying to control your breathing. It can be done while sitting or walking. Centering prayer. This involves focusing on a word, phrase, or sacred image that means something to you and brings you peace. Deep breathing. To do this, expand your stomach and inhale slowly through your nose. Hold your breath for 3-5 seconds. Then exhale slowly, letting your stomach muscles relax. Self-talk. Learn to notice and identify  thought patterns that lead to anxiety reactions and change those patterns to thoughts that feel peaceful. Muscle relaxation. Taking time to tense muscles and then relax them. Choose a tension reduction technique that fits your lifestyle and personality. These techniques take time and practice. Set aside 5-15 minutes a day to do them. Therapists can offer counseling and training in these techniques. The training to help with anxiety may be covered by some insurance plans. Other things you can do to manage stress and anxiety include: Keeping a stress diary. This can help you learn what triggers your reaction and then learn ways to manage your response. Thinking about how you react to certain situations. You may not be able to control everything, but you can control your response. Making time for activities that help you relax and not feeling guilty about spending your time in this way. Doing visual imagery. This involves imagining or creating mental pictures to help you relax. Practicing yoga. Through yoga poses, you can lower tension and promote relaxation.  Medicines Medicines can help ease symptoms. Medicines for anxiety include: Antidepressant medicines. These are usually prescribed for long-term daily control. Anti-anxiety medicines. These may be added in severe cases, especially when panic attacks occur. Medicines will be prescribed by a health care provider. When used together, medicines, psychotherapy, and tension reduction techniques may be the most effective treatment. Relationships Relationships can play a big part in helping you recover. Try to spend more time connecting with trusted friends and family members. Consider going to couples counseling if you have a partner, taking family education classes, or going to family  therapy. Therapy can help you and others better understand your condition. How to recognize changes in your anxiety Everyone responds differently to treatment for  anxiety. Recovery from anxiety happens when symptoms decrease and stop interfering with your daily activities at home or work. This may mean that you will start to: Have better concentration and focus. Worry will interfere less in your daily thinking. Sleep better. Be less irritable. Have more energy. Have improved memory. It is also important to recognize when your condition is getting worse. Contact your health care provider if your symptoms interfere with home or work and you feel like your condition is not improving. Follow these instructions at home: Activity Exercise. Adults should do the following: Exercise for at least 150 minutes each week. The exercise should increase your heart rate and make you sweat (moderate-intensity exercise). Strengthening exercises at least twice a week. Get the right amount and quality of sleep. Most adults need 7-9 hours of sleep each night. Lifestyle  Eat a healthy diet that includes plenty of vegetables, fruits, whole grains, low-fat dairy products, and lean protein. Do not eat a lot of foods that are high in fats, added sugars, or salt (sodium). Make choices that simplify your life. Do not use any products that contain nicotine or tobacco. These products include cigarettes, chewing tobacco, and vaping devices, such as e-cigarettes. If you need help quitting, ask your health care provider. Avoid caffeine, alcohol, and certain over-the-counter cold medicines. These may make you feel worse. Ask your pharmacist which medicines to avoid. General instructions Take over-the-counter and prescription medicines only as told by your health care provider. Keep all follow-up visits. This is important. Where to find support You can get help and support from these sources: Self-help groups. Online and [employer]. A trusted spiritual leader. Couples counseling. Family education classes. Family therapy. Where to find more information You may find  that joining a support group helps you deal with your anxiety. The following sources can help you locate counselors or support groups near you: [place]: www.mentalhealthamerica.net Anxiety and Depression Association of [place] (ADAA): https://www.clark.net/ National Alliance on Mental Illness (NAMI): www.nami.org Contact a health care provider if: You have a hard time staying focused or finishing daily tasks. You spend many hours a day feeling worried about everyday life. You become exhausted by worry. You start to have headaches or frequently feel tense. You develop chronic nausea or diarrhea. Get help right away if: You have a racing heart and shortness of breath. You have thoughts of hurting yourself or others. If you ever feel like you may hurt yourself or others, or have thoughts about taking your own life, get help right away. Go to your nearest emergency department or: Call your local emergency services (911 in the [place] Call a suicide crisis helpline, such as the [place] at (567)351-2375 or 988 in the [place]. This is open 24 hours a day in the [place] Text the Crisis Text Line at 208-517-4183 (in the [place].). Summary Taking steps to learn and use tension reduction techniques can help calm you and help prevent triggering an anxiety reaction. When used together, medicines, psychotherapy, and tension reduction techniques may be the most effective treatment. Family, friends, and partners can play a big part in supporting you. This information is not intended to replace advice given to you by your health care provider. Make sure you discuss any questions you have with your health care provider. Document Revised: 04/16/2021 Document Reviewed: 01/12/2021 Elsevier Patient  Education  2022 Elsevier Inc. Major Depressive Disorder, Adult Major depressive disorder (MDD) is a mental health condition. It may also be called clinical depression or unipolar depression. MDD causes  symptoms of sadness, hopelessness, and loss of interest in things. These symptoms last most of the day, almost every day, for 2 weeks. MDD can also cause physical symptoms. It can interfere with relationships and with everyday activities, such as work, school, and activities that are usually pleasant. MDD may be mild, moderate, or severe. It may be single-episode MDD, which happens once, or recurrent MDD, which may occur multiple times. What are the causes? The exact cause of this condition is not known. MDD is most likely caused by a combination of things, which may include: Your personality traits. Learned or conditioned behaviors or thoughts or feelings that reinforce negativity. Any alcohol or substance misuse. Long-term (chronic) physical or mental health illness. Going through a traumatic experience or major life changes. What increases the risk? The following factors may make someone more likely to develop MDD: A family history of depression. Being a woman. Troubled family relationships. Abnormally low levels of certain brain chemicals. Traumatic or painful events in childhood, especially abuse or loss of a parent. A lot of stress from life experiences, such as poor living conditions or discrimination. Chronic physical illness or other mental health disorders. What are the signs or symptoms? The main symptoms of MDD usually include: Constant depressed or irritable mood. A loss of interest in things and activities. Other symptoms include: Sleeping or eating too much or too little. Unexplained weight gain or weight loss. Tiredness or low energy. Being agitated, restless, or weak. Feeling hopeless, worthless, or guilty. Trouble thinking clearly or making decisions. Thoughts of suicide or thoughts of harming others. Isolating oneself or avoiding other people or activities. Trouble completing tasks, work, or any normal obligations. Severe symptoms of this condition may  include: Psychotic depression.This may include false beliefs, or delusions. It may also include seeing, hearing, tasting, smelling, or feeling things that are not real (hallucinations). Chronic depression or persistent depressive disorder. This is low-level depression that lasts for at least 2 years. Melancholic depression, or feeling extremely sad and hopeless. Catatonic depression, which includes trouble speaking and trouble moving. How is this diagnosed? This condition may be diagnosed based on: Your symptoms. Your medical and mental health history. You may be asked questions about your lifestyle, including any drug and alcohol use. A physical exam. Blood tests to rule out other conditions. MDD is confirmed if you have the following symptoms most of the day, nearly every day, in a 2-week period: Either a depressed mood or loss of interest. At least four other MDD symptoms. How is this treated? This condition is usually treated by mental health professionals, such as psychologists, psychiatrists, and clinical social workers. You may need more than one type of treatment. Treatment may include: Psychotherapy, also called talk therapy or counseling. Types of psychotherapy include: Cognitive behavioral therapy (CBT). This teaches you to recognize unhealthy feelings, thoughts, and behaviors, and replace them with positive thoughts and actions. Interpersonal therapy (IPT). This helps you to improve the way you communicate with others or relate to them. Family therapy. This treatment includes members of your family. Medicines to treat anxiety and depression. These medicines help to balance the brain chemicals that affect your emotions. Lifestyle changes. You may be asked to: Limit alcohol use and avoid drug use. Get regular exercise. Get plenty of sleep. Make healthy eating choices. Spend more time  outdoors. Brain stimulation. This may be done if symptoms are very severe and other treatments  have not worked. Examples of this treatment are electroconvulsive therapy and transcranial magnetic stimulation. Follow these instructions at home: Activity Exercise regularly and spend time outdoors. Find activities that you enjoy doing, and make time to do them. Find healthy ways to manage stress, such as: Meditation or deep breathing. Spending time in nature. Journaling. Return to your normal activities as told by your health care provider. Ask your health care provider what activities are safe for you. Alcohol and drug use If you drink alcohol: Limit how much you use to: 0-1 drink a day for women who are not pregnant. 0-2 drinks a day for men. Be aware of how much alcohol is in your drink. In the [place] one drink equals one 12 oz bottle of beer (355 mL), one 5 oz glass of wine (148 mL), or one 1 oz glass of hard liquor (44 mL). Discuss your alcohol use with your health care provider. Alcohol can affect any antidepressant medicines you are taking. Discuss any drug use with your health care provider. General instructions  Take over-the-counter and prescription medicines only as told by your health care provider. Eat a healthy diet and get plenty of sleep. Consider joining a support group. Your health care provider may be able to recommend one. Keep all follow-up visits as told by your health care provider. This is important. Where to find more information [employer] on Mental Illness: www.nami.org [place] [employer] of Mental Health: http://www.maynard.net/ Contact a health care provider if: Your symptoms get worse. You develop new symptoms. Get help right away if: You self-harm. You have serious thoughts about hurting yourself or others. You hallucinate. If you ever feel like you may hurt yourself or others, or have thoughts about taking your own life, get help right away. Go to your nearest emergency department or: Call your local emergency services (911 in the  [place] Call a suicide crisis helpline, such as the National Suicide Prevention Lifeline at 872-177-9338 or 988 in the [place] This is open 24 hours a day in the [place] Text the Crisis Text Line at 867-686-9765 (in the [place] Summary Major depressive disorder (MDD) is a mental health condition. MDD causes symptoms of sadness, hopelessness, and loss of interest in things. These symptoms last most of the day, almost every day, for 2 weeks. The symptoms of MDD can interfere with relationships and with everyday activities. Treatments and support are available for people who develop MDD. You may need more than one type of treatment. Get help right away if you have serious thoughts about hurting yourself or others. This information is not intended to replace advice given to you by your health care provider. Make sure you discuss any questions you have with your health care provider. Document Revised: 04/16/2021 Document Reviewed: 09/02/2019 Elsevier Patient Education  2022 [employer].

## 2021-10-29 NOTE — Assessment & Plan Note (Signed)
Patient is a 31 year old female who presents to clinic establishing care today.  Weight complains of depression and anxiety not well controlled. Completed assessment, provided education to patient on preventative and health maintenance.  Patient will make appointment for complete physical and labs.  Recently completed Pap with OB/GYN.

## 2021-10-29 NOTE — Assessment & Plan Note (Signed)
Patient is establishing care today, depression not well controlled.  Patient has tried several treatments that did not work.  Currently on Effexor.  Completed PHQ-9. Schedule the patient for GeneSight assessment.  Education provided to patient with printed handouts given. Patient will follow-up after GeneSight recommendations.  Provided counseling as a resources to help with depression and anxiety.

## 2021-10-29 NOTE — Progress Notes (Signed)
New Patient Note  RE: Lindsay Sanders MRN: [id number] DOB: 1991-09-02 Date of Office Visit: 10/29/2021  Chief Complaint: New Patient (Initial Visit), Depression, and Anxiety  History of Present Illness: Depression: Patient complains of depression. She complains of anhedonia, depressed mood, and difficulty concentrating. Onset was approximately a few years ago, unchanged since that time.  She denies current suicidal and homicidal plan or intent.   Family history significant for no psychiatric illness.Possible organic causes contributing are: none.  Risk factors: previous episode of depression Previous treatment includes Effexor . She complains of the following side effects from the treatment: weight loss and none therapeutic .   GAD 7 : Generalized Anxiety Score 10/29/2021  Nervous, Anxious, on Edge 1  Control/stop worrying 3  Worry too much - different things 3  Trouble relaxing 2  Restless 0  Easily annoyed or irritable 3  Afraid - awful might happen 0  Total GAD 7 Score 12  Anxiety Difficulty Very difficult      Anxiety: Patient complains of anxiety disorder.  She has the following symptoms: none. Onset of symptoms was approximately a few years ago, unchanged since that time. She denies current suicidal and homicidal ideation. Family history significant for no psychiatric illness.Possible organic causes contributing are: none. Risk factors:  Previous treatment includes Effexor and  Effexor .  She complains of the following side effects from the treatment: none.   GAD 7 : Generalized Anxiety Score 10/29/2021  Nervous, Anxious, on Edge 1  Control/stop worrying 3  Worry too much - different things 3  Trouble relaxing 2  Restless 0  Easily annoyed or irritable 3  Afraid - awful might happen 0  Total GAD 7 Score 12  Anxiety Difficulty Very difficult      Assessment and Plan: Lindsay Sanders is a 31 y.o. female with: Depression, major, single episode, moderate ([place]) Patient is  establishing care today, depression not well controlled.  Patient has tried several treatments that did not work.  Currently on Effexor.  Completed PHQ-9. Schedule the patient for GeneSight assessment.  Education provided to patient with printed handouts given. Patient will follow-up after GeneSight recommendations.  Provided counseling as a resources to help with depression and anxiety.  Anxiety Patient is establishing care today, anxiety not well controlled.  Patient has tried several treatments that did not work.  Currently on Effexor.  Completed GAD-7 Schedule the patient for GeneSight assessment.  Education provided to patient with printed handouts given. Patient will follow-up after GeneSight recommendations.  Provided counseling as a resources to help with depression and anxiety.  Establishing care with new doctor, encounter for Patient is a 31 year old female who presents to clinic establishing care today.  Weight complains of depression and anxiety not well controlled. Completed assessment, provided education to patient on preventative and health maintenance.  Patient will make appointment for complete physical and labs.  Recently completed Pap with OB/GYN.  No follow-ups on file.   Diagnostics:   Past Medical History: Patient Active Problem List   Diagnosis Date Noted   Depression, major, single episode, moderate ([place]) 10/29/2021   Anxiety 10/29/2021   Establishing care with new doctor, encounter for 10/29/2021   Past Medical History:  Diagnosis Date   Anxiety    Depression    Hypertension    Past Surgical History: Past Surgical History:  Procedure Laterality Date   CESAREAN SECTION  2009   WISDOM TOOTH EXTRACTION  2011   Medication List:  No current outpatient medications on file.  No current facility-administered medications for this visit.   Allergies: Allergies  Allergen Reactions   Lexapro [Escitalopram]    Social History: Social History    Socioeconomic History   Marital status: Single    Spouse name: Not on file   Number of children: 1   Years of education: 16   Highest education level: Associate degree: occupational, Hotel manager, or vocational program  Occupational History   Not on file  Tobacco Use   Smoking status: Never   Smokeless tobacco: Never  Vaping Use   Vaping Use: Never used  Substance and Sexual Activity   Alcohol use: Yes    Alcohol/week: 7.0 standard drinks    Types: 4 Cans of beer, 3 Shots of liquor per week   Drug use: Never   Sexual activity: Not Currently  Other Topics Concern   Not on file  Social History Narrative   Lives alone with son. Likes to go to pool. Go to beach when possible.    Social Determinants of Health   Financial Resource Strain: Not on file  Food Insecurity: Not on file  Transportation Needs: Not on file  Physical Activity: Not on file  Stress: Not on file  Social Connections: Not on file       Family History: Family History  Problem Relation Age of Onset   Hypertension Mother    ADD / ADHD Son    Hypertension Maternal Grandmother    Arthritis Maternal Grandmother    Diabetes Paternal Grandfather          Review of Systems  Constitutional: Negative.   HENT: Negative.    Eyes: Negative.   Respiratory: Negative.    Cardiovascular: Negative.   Gastrointestinal: Negative.   Skin: Negative.  Negative for rash.  Psychiatric/Behavioral:  Negative for sleep disturbance. The patient is nervous/anxious.   All other systems reviewed and are negative.   Objective: BP 136/83    Pulse 74    Temp 98.2 F (36.8 C) (Temporal)    Ht 5\' 8"  (1.727 m)    Wt 249 lb (112.9 kg)    BMI 37.86 kg/m  Body mass index is 37.86 kg/m. Physical Exam Vitals and nursing note reviewed.  Constitutional:      Appearance: Normal appearance.  HENT:     Right Ear: External ear normal.     Left Ear: External ear normal.     Mouth/Throat:     Mouth: Mucous membranes are moist.      Pharynx: Oropharynx is clear.  Eyes:     Conjunctiva/sclera: Conjunctivae normal.  Cardiovascular:     Rate and Rhythm: Normal rate and regular rhythm.  Pulmonary:     Effort: Pulmonary effort is normal.     Breath sounds: Normal breath sounds.  Abdominal:     General: Bowel sounds are normal.  Musculoskeletal:        General: Normal range of motion.  Skin:    General: Skin is warm.  Neurological:     Mental Status: She is alert and oriented to person, place, and time.  Psychiatric:        Attention and Perception: Attention normal.        Mood and Affect: Mood is anxious and depressed.        Speech: Speech normal.        Behavior: Behavior is cooperative.        Thought Content: Thought content normal.        Cognition and Memory: Cognition and memory  normal.   The plan was reviewed with the patient/family, and all questions/concerned were addressed.  It was my pleasure to see Shameeka today and participate in her care. Please feel free to contact me with any questions or concerns.  Sincerely,  Jac Canavan NP [place]

## 2021-10-29 NOTE — Assessment & Plan Note (Signed)
Patient is establishing care today, anxiety not well controlled.  Patient has tried several treatments that did not work.  Currently on Effexor.  Completed GAD-7 Schedule the patient for GeneSight assessment.  Education provided to patient with printed handouts given. Patient will follow-up after GeneSight recommendations.  Provided counseling as a resources to help with depression and anxiety.

## 2021-10-31 ENCOUNTER — Ambulatory Visit: Payer: 59 | Admitting: Family Medicine

## 2021-10-31 ENCOUNTER — Encounter: Payer: Self-pay | Admitting: Family Medicine

## 2021-10-31 VITALS — BP 138/80 | HR 71 | Temp 97.9°F | Ht 68.0 in | Wt 248.4 lb

## 2021-10-31 DIAGNOSIS — F419 Anxiety disorder, unspecified: Secondary | ICD-10-CM | POA: Diagnosis not present

## 2021-10-31 DIAGNOSIS — F321 Major depressive disorder, single episode, moderate: Secondary | ICD-10-CM

## 2021-10-31 DIAGNOSIS — F411 Generalized anxiety disorder: Secondary | ICD-10-CM | POA: Diagnosis not present

## 2021-10-31 NOTE — Progress Notes (Signed)
° °  Assessment & Plan:  1-2. Depression, major, single episode, moderate (HCC)/Anxiety Uncontrolled. GeneSight testing completed today.   Follow up plan: Return in about 2 months (around 12/29/2021) for anxiety & depression with PCP.  [name], MSN, APRN, FNP-C [facility]  Subjective:   Patient ID: Lindsay Sanders, female    DOB: 1991-02-13, 31 y.o.   MRN: [id number]  HPI: Lindsay Sanders is a 31 y.o. female presenting on 10/31/2021 for gene sight  Patient is here for GeneSight testing due to uncontrolled depression and anxiety. She has previously failed treatment with Effexor, Lexapro, Wellbutrin, and Viibryd. She is not currently on medication as she is waiting for the results from this test to start something new.   ROS: Negative unless specifically indicated above in HPI.   Relevant past medical history reviewed and updated as indicated.   Allergies and medications reviewed and updated.  No current outpatient medications on file.  Allergies  Allergen Reactions   Lexapro [Escitalopram]     Objective:   BP 138/80    Pulse 71    Temp 97.9 F (36.6 C) (Temporal)    Ht 5\' 8"  (1.727 m)    Wt 248 lb 6.4 oz (112.7 kg)    BMI 37.77 kg/m    Physical Exam Vitals reviewed.  Constitutional:      General: She is not in acute distress.    Appearance: Normal appearance. She is not ill-appearing, toxic-appearing or diaphoretic.  HENT:     Head: Normocephalic and atraumatic.  Eyes:     General: No scleral icterus.       Right eye: No discharge.        Left eye: No discharge.     Conjunctiva/sclera: Conjunctivae normal.  Cardiovascular:     Rate and Rhythm: Normal rate.  Pulmonary:     Effort: Pulmonary effort is normal. No respiratory distress.  Musculoskeletal:        General: Normal range of motion.     Cervical back: Normal range of motion.  Skin:    General: Skin is warm and dry.     Capillary Refill: Capillary refill takes less than 2  seconds.  Neurological:     General: No focal deficit present.     Mental Status: She is alert and oriented to person, place, and time. Mental status is at baseline.  Psychiatric:        Mood and Affect: Mood normal.        Behavior: Behavior normal.        Thought Content: Thought content normal.        Judgment: Judgment normal.

## 2021-11-10 ENCOUNTER — Ambulatory Visit: Payer: Self-pay | Admitting: Nurse Practitioner

## 2021-11-14 ENCOUNTER — Telehealth: Payer: Self-pay | Admitting: Nurse Practitioner

## 2021-11-16 NOTE — Telephone Encounter (Signed)
Results printed off Friday and given to PCP.  She will contact patient when she has had time to review.

## 2021-11-17 ENCOUNTER — Other Ambulatory Visit: Payer: Self-pay | Admitting: Nurse Practitioner

## 2021-11-17 MED ORDER — AMITRIPTYLINE HCL 25 MG PO TABS: 25.0000 mg | ORAL_TABLET | Freq: Every day | ORAL | 1 refills | Status: DC

## 2021-11-17 NOTE — Telephone Encounter (Signed)
Pt aware of provider feedback and says she doesn't care which one you send in. She is ok with either wellbutrin or elavil. Please send one in.

## 2021-11-17 NOTE — Telephone Encounter (Signed)
Lmtcb 2/13-ks

## 2021-12-02 NOTE — Telephone Encounter (Signed)
Attempt to contact pt without a return call in over 3 days, will close encounter. 

## 2021-12-11 ENCOUNTER — Other Ambulatory Visit: Payer: Self-pay | Admitting: Nurse Practitioner

## 2021-12-17 ENCOUNTER — Other Ambulatory Visit: Payer: Self-pay

## 2021-12-17 ENCOUNTER — Telehealth: Payer: Self-pay | Admitting: Nurse Practitioner

## 2021-12-17 ENCOUNTER — Other Ambulatory Visit: Payer: Self-pay | Admitting: Family Medicine

## 2021-12-17 MED ORDER — SEMAGLUTIDE-WEIGHT MANAGEMENT 1.7 MG/0.75ML ~~LOC~~ SOAJ: 1.7000 mg | SUBCUTANEOUS | 5 refills | Status: DC

## 2021-12-17 NOTE — Telephone Encounter (Signed)
I sent in a scrip to [place] [place] [facility]. May require prior auth ?

## 2021-12-17 NOTE — Telephone Encounter (Signed)
Pt called to see if PCP ever had a chance to see if pts insurance would cover the [facility] Rx as they discussed? 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 ? ?Please advise and call patient.  ?

## 2021-12-30 ENCOUNTER — Encounter: Payer: Self-pay | Admitting: Nurse Practitioner

## 2021-12-30 ENCOUNTER — Encounter: Payer: Self-pay | Admitting: *Deleted

## 2021-12-30 DIAGNOSIS — Z6837 Body mass index (BMI) 37.0-37.9, adult: Secondary | ICD-10-CM | POA: Insufficient documentation

## 2021-12-30 NOTE — Telephone Encounter (Signed)
Pt called stating that she received a call from nurse Rocky Link to see how she was doing on the [facility] medicine. ? ?Pt says she hasnt been able to pick up the medicine because her pharmacy says it requires a prior auth and they havent received anything from our office. ? ?

## 2022-01-02 NOTE — Telephone Encounter (Signed)
PA takes a little time, when the are completed patient will be notified of the outcome. ?

## 2022-01-05 ENCOUNTER — Other Ambulatory Visit: Payer: Self-pay | Admitting: Nurse Practitioner

## 2022-01-05 ENCOUNTER — Telehealth: Payer: Self-pay | Admitting: Nurse Practitioner

## 2022-01-05 DIAGNOSIS — Z6837 Body mass index (BMI) 37.0-37.9, adult: Secondary | ICD-10-CM

## 2022-01-05 MED ORDER — SEMAGLUTIDE-WEIGHT MANAGEMENT 1.7 MG/0.75ML ~~LOC~~ SOAJ: 1.7000 mg | SUBCUTANEOUS | 0 refills | Status: AC

## 2022-01-05 MED ORDER — SEMAGLUTIDE-WEIGHT MANAGEMENT 0.25 MG/0.5ML ~~LOC~~ SOAJ: 0.2500 mg | SUBCUTANEOUS | 0 refills | Status: AC

## 2022-01-05 MED ORDER — SEMAGLUTIDE-WEIGHT MANAGEMENT 2.4 MG/0.75ML ~~LOC~~ SOAJ: 2.4000 mg | SUBCUTANEOUS | 0 refills | Status: AC

## 2022-01-05 MED ORDER — SEMAGLUTIDE-WEIGHT MANAGEMENT 1 MG/0.5ML ~~LOC~~ SOAJ
1.0000 mg | SUBCUTANEOUS | 0 refills | Status: AC
  Filled 2022-04-27: qty 2, 28d supply, fill #0

## 2022-01-05 MED ORDER — SEMAGLUTIDE-WEIGHT MANAGEMENT 0.5 MG/0.5ML ~~LOC~~ SOAJ: 0.5000 mg | SUBCUTANEOUS | 0 refills | Status: AC

## 2022-01-05 NOTE — Telephone Encounter (Signed)
Contacted patient.  She states that script was written for 1.7 mg injectable.  Pharmacy explains that the normal is step therapy starting with 0.25 injectable.  Initial script was written by Dr. Livia Snellen.  I spoke to [facility] and she is going to send in a corrected script starting at 0.25. Patient notified ?

## 2022-01-05 NOTE — Telephone Encounter (Signed)
Medication and new dosage sent to pharmacy.  Patient follow-up 4 weeks ?

## 2022-01-05 NOTE — Telephone Encounter (Signed)
Patient called to check on dose of Wegovy prescribed for her.  Pharmacy wanted her to check on this dose of 1.7 mg as this is normally not the starting dose.  Please call. ?

## 2022-01-06 ENCOUNTER — Ambulatory Visit: Payer: 59 | Admitting: Nurse Practitioner

## 2022-01-06 NOTE — Telephone Encounter (Signed)
Patient notified

## 2022-01-14 ENCOUNTER — Encounter: Payer: Self-pay | Admitting: Nurse Practitioner

## 2022-01-14 ENCOUNTER — Ambulatory Visit: Payer: 59 | Admitting: Nurse Practitioner

## 2022-01-14 VITALS — BP 139/83 | HR 84 | Temp 97.8°F | Resp 16 | Ht 68.0 in | Wt 252.0 lb

## 2022-01-14 DIAGNOSIS — R300 Dysuria: Secondary | ICD-10-CM

## 2022-01-14 LAB — URINALYSIS, ROUTINE W REFLEX MICROSCOPIC
Bilirubin, UA: NEGATIVE
Glucose, UA: NEGATIVE
Ketones, UA: NEGATIVE
Leukocytes,UA: NEGATIVE
Nitrite, UA: NEGATIVE
Protein,UA: NEGATIVE
RBC, UA: NEGATIVE
Specific Gravity, UA: <1.005 — ABNORMAL LOW (ref 1.005–1.030)
Urobilinogen, Ur: 0.2 mg/dL (ref 0.2–1.0)
pH, UA: 6 (ref 5.0–7.5)

## 2022-01-14 MED ORDER — PHENAZOPYRIDINE HCL 95 MG PO TABS: 95.0000 mg | ORAL_TABLET | Freq: Three times a day (TID) | ORAL | 0 refills | Status: DC | PRN

## 2022-01-14 NOTE — Patient Instructions (Signed)
Dysuria ?Dysuria is pain or discomfort during urination. The pain or discomfort may be felt in the part of the body that drains urine from the bladder (urethra) or in the surrounding tissue of the genitals. The pain may also be felt in the groin area, lower abdomen, or lower back. ?You may have to urinate frequently or have the sudden feeling that you have to urinate (urgency). Dysuria can affect anyone, but it is more common in females. Dysuria can be caused by many different things, including: ?Urinary tract infection. ?Kidney stones or bladder stones. ?Certain STIs (sexually transmitted infections), such as chlamydia. ?Dehydration. ?Inflammation of the tissues of the vagina. ?Use of certain medicines. ?Use of certain soaps or scented products that cause irritation. ?Follow these instructions at home: ?Medicines ?Take over-the-counter and prescription medicines only as told by your health care provider. ?If you were prescribed an antibiotic medicine, take it as told by your health care provider. Do not stop taking the antibiotic even if you start to feel better. ?Eating and drinking ? ?Drink enough fluid to keep your urine pale yellow. ?Avoid caffeinated beverages, tea, and alcohol. These beverages can irritate the bladder and make dysuria worse. In males, alcohol may irritate the prostate. ?General instructions ?Watch your condition for any changes. ?Urinate often. Avoid holding urine for long periods of time. ?If you are female, you should wipe from front to back after urinating or having a bowel movement. Use each piece of toilet paper only once. ?Empty your bladder after sex. ?Keep all follow-up visits. This is important. ?If you had any tests done to find the cause of dysuria, it is up to you to get your test results. Ask your health care provider, or the department that is doing the test, when your results will be ready. ?Contact a health care provider if: ?You have a fever. ?You develop pain in your back or  sides. ?You have nausea or vomiting. ?You have blood in your urine. ?You are not urinating as often as you usually do. ?Get help right away if: ?Your pain is severe and not relieved with medicines. ?You cannot eat or drink without vomiting. ?You are confused. ?You have a rapid heartbeat while resting. ?You have shaking or chills. ?You feel extremely weak. ?Summary ?Dysuria is pain or discomfort while urinating. Many different conditions can lead to dysuria. ?If you have dysuria, you may have to urinate frequently or have the sudden feeling that you have to urinate (urgency). ?Watch your condition for any changes. Keep all follow-up visits. ?Make sure that you urinate often and drink enough fluid to keep your urine pale yellow. ?This information is not intended to replace advice given to you by your health care provider. Make sure you discuss any questions you have with your health care provider. ?Document Revised: 05/03/2020 Document Reviewed: 05/03/2020 ?Elsevier Patient Education ? 2022 Elsevier Inc[employer] ? ?

## 2022-01-14 NOTE — Progress Notes (Signed)
? ? ? ?Acute Office Visit ? ?Subjective:  ? ? Patient ID: Lindsay Sanders, female    DOB: 05/05/1991, 31 y.o.   MRN: [id number] ? ?Chief Complaint  ?Patient presents with  ? Urinary Tract Infection  ?  X 2 days   ? ? ?Dysuria  ?This is a new problem. The current episode started yesterday. The problem occurs intermittently. The problem has been gradually improving. The quality of the pain is described as burning. The pain is mild. There has been no fever. Associated symptoms include frequency. Pertinent negatives include no chills, discharge, flank pain, hematuria, hesitancy, nausea or vomiting. She has tried nothing for the symptoms.  ? ?Past Medical History:  ?Diagnosis Date  ? Anxiety   ? Depression   ? Hypertension   ? ? ?Past Surgical History:  ?Procedure Laterality Date  ? CESAREAN SECTION  2009  ? WISDOM TOOTH EXTRACTION  2011  ? ? ?Family History  ?Problem Relation Age of Onset  ? Hypertension Mother   ? ADD / ADHD Son   ? Hypertension Maternal Grandmother   ? Arthritis Maternal Grandmother   ? Diabetes Paternal Grandfather   ? ? ?Social History  ? ?Socioeconomic History  ? Marital status: Single  ?  Spouse name: Not on file  ? Number of children: 1  ? Years of education: 83  ? Highest education level: Associate degree: occupational, Scientist, product/process development, or vocational program  ?Occupational History  ? Not on file  ?Tobacco Use  ? Smoking status: Never  ? Smokeless tobacco: Never  ?Vaping Use  ? Vaping Use: Never used  ?Substance and Sexual Activity  ? Alcohol use: Yes  ?  Alcohol/week: 7.0 standard drinks  ?  Types: 4 Cans of beer, 3 Shots of liquor per week  ? Drug use: Never  ? Sexual activity: Not Currently  ?Other Topics Concern  ? Not on file  ?Social History Narrative  ? Lives alone with son. Likes to go to pool. Go to beach when possible.   ? ?Social Determinants of Health  ? ?Financial Resource Strain: Not on file  ?Food Insecurity: Not on file  ?Transportation Needs: Not on file  ?Physical Activity: Not on  file  ?Stress: Not on file  ?Social Connections: Not on file  ?Intimate Partner Violence: Not on file  ? ? ?Outpatient Medications Prior to Visit  ?Medication Sig Dispense Refill  ? Semaglutide-Weight Management 0.25 MG/0.5ML SOAJ Inject 0.25 mg into the skin once a week for 28 days. 2 mL 0  ? [START ON 02/03/2022] Semaglutide-Weight Management 0.5 MG/0.5ML SOAJ Inject 0.5 mg into the skin once a week for 28 days. 2 mL 0  ? [START ON 03/04/2022] Semaglutide-Weight Management 1 MG/0.5ML SOAJ Inject 1 mg into the skin once a week for 28 days. 2 mL 0  ? [START ON 04/02/2022] Semaglutide-Weight Management 1.7 MG/0.75ML SOAJ Inject 1.7 mg into the skin once a week for 28 days. 3 mL 0  ? [START ON 05/01/2022] Semaglutide-Weight Management 2.4 MG/0.75ML SOAJ Inject 2.4 mg into the skin once a week for 28 days. 3 mL 0  ? amitriptyline (ELAVIL) 25 MG tablet Take 1 tablet (25 mg total) by mouth at bedtime. For one week, increase to 50 mg at bed time week 2, increase to 75 mg week three, stay on that dose, F/U in 6-12 weeks 90 tablet 1  ? ?No facility-administered medications prior to visit.  ? ? ?Allergies  ?Allergen Reactions  ? Lexapro [Escitalopram]   ? ? ?  Review of Systems  ?Constitutional:  Negative for chills.  ?HENT: Negative.    ?Eyes: Negative.   ?Respiratory: Negative.    ?Gastrointestinal:  Negative for abdominal distention, abdominal pain, nausea and vomiting.  ?Genitourinary:  Positive for dysuria and frequency. Negative for flank pain, hematuria and hesitancy.  ?All other systems reviewed and are negative. ? ?   ?Objective:  ?  ?Physical Exam ?Vitals and nursing note reviewed.  ?Constitutional:   ?   Appearance: Normal appearance.  ?HENT:  ?   Head: Normocephalic.  ?   Nose: Nose normal.  ?Eyes:  ?   Conjunctiva/sclera: Conjunctivae normal.  ?Cardiovascular:  ?   Rate and Rhythm: Normal rate and regular rhythm.  ?   Pulses: Normal pulses.  ?   Heart sounds: Normal heart sounds.  ?Pulmonary:  ?   Effort: Pulmonary  effort is normal.  ?   Breath sounds: Normal breath sounds.  ?Abdominal:  ?   General: Bowel sounds are normal.  ?   Tenderness: There is no abdominal tenderness. There is no right CVA tenderness or left CVA tenderness.  ?Skin: ?   General: Skin is warm.  ?   Findings: No rash.  ?Neurological:  ?   General: No focal deficit present.  ?   Mental Status: She is alert and oriented to person, place, and time.  ?Psychiatric:     ?   Behavior: Behavior normal.  ? ? ?BP 139/83   Pulse 84   Temp 97.8 ?F (36.6 ?C)   Resp 16   Ht 5\' 8"  (1.727 m)   Wt 252 lb (114.3 kg)   SpO2 99%   BMI 38.32 kg/m?  ?Wt Readings from Last 3 Encounters:  ?01/14/22 252 lb (114.3 kg)  ?10/31/21 248 lb 6.4 oz (112.7 kg)  ?10/29/21 249 lb (112.9 kg)  ? ? ?There are no preventive care reminders to display for this patient. ? ?There are no preventive care reminders to display for this patient. ? ? ?No results found for: TSH ?Lab Results  ?Component Value Date  ? WBC 9.9 12/06/2019  ? HGB 14.0 12/06/2019  ? HCT 43.3 12/06/2019  ? MCV 93.9 12/06/2019  ? PLT 260 12/06/2019  ? ?Lab Results  ?Component Value Date  ? NA 137 12/06/2019  ? K 3.7 12/06/2019  ? CO2 24 12/06/2019  ? GLUCOSE 104 (H) 12/06/2019  ? BUN 14 12/06/2019  ? CREATININE 0.92 12/06/2019  ? BILITOT 0.8 12/06/2019  ? ALKPHOS 58 12/06/2019  ? AST 40 12/06/2019  ? ALT 27 12/06/2019  ? PROT 6.8 12/06/2019  ? ALBUMIN 3.8 12/06/2019  ? CALCIUM 9.0 12/06/2019  ? ANIONGAP 11 12/06/2019  ? ?No results found for: CHOL ?No results found for: HDL ?No results found for: LDLCALC ?No results found for: TRIG ?No results found for: CHOLHDL ?No results found for: HGBA1C ? ?   ?Assessment & Plan:  ? ?UTI  vs  interstitial cystitis ?-urinalysis completed results pending ?-pyridium for pain ?Precaution and education provided ?-All questions answered ?-follow up with unresolved symptoms  ?Problem List Items Addressed This Visit   ?None ?Visit Diagnoses   ? ? Dysuria    -  Primary  ? Relevant Medications   ? phenazopyridine (PYRIDIUM) 95 MG tablet  ? Other Relevant Orders  ? Urine Culture  ? POCT urinalysis dipstick  ? Urinalysis, Routine w reflex microscopic (Completed)  ? ?  ? ? ? ?Meds ordered this encounter  ?Medications  ? phenazopyridine (PYRIDIUM) 95 MG tablet  ?  Sig: Take 1 tablet (95 mg total) by mouth 3 (three) times daily as needed for pain.  ?  Dispense:  10 tablet  ?  Refill:  0  ?  Order Specific Question:   Supervising Provider  ?  AnswerMechele Claude [[id number]]  ? ? ? ?Daryll Drown, NP ? ?

## 2022-01-16 LAB — URINE CULTURE

## 2022-02-24 ENCOUNTER — Encounter: Payer: Self-pay | Admitting: Nurse Practitioner

## 2022-02-24 ENCOUNTER — Ambulatory Visit: Payer: 59 | Admitting: Nurse Practitioner

## 2022-02-24 VITALS — BP 127/80 | HR 69 | Temp 97.7°F | Ht 68.0 in | Wt 254.0 lb

## 2022-02-24 DIAGNOSIS — F339 Major depressive disorder, recurrent, unspecified: Secondary | ICD-10-CM | POA: Diagnosis not present

## 2022-02-24 DIAGNOSIS — F419 Anxiety disorder, unspecified: Secondary | ICD-10-CM

## 2022-02-24 MED ORDER — DESVENLAFAXINE SUCCINATE ER 25 MG PO TB24: 25.0000 mg | ORAL_TABLET | Freq: Every day | ORAL | 2 refills | Status: DC

## 2022-02-24 NOTE — Patient Instructions (Signed)
Follow up in 6-12 weeks   Generalized Anxiety Disorder, Adult Generalized anxiety disorder (GAD) is a mental health condition. Unlike normal worries, anxiety related to GAD is not triggered by a specific event. These worries do not fade or get better with time. GAD interferes with relationships, work, and school. GAD symptoms can vary from mild to severe. People with severe GAD can have intense waves of anxiety with physical symptoms that are similar to panic attacks. What are the causes? The exact cause of GAD is not known, but the following are believed to have an impact: Differences in natural brain chemicals. Genes passed down from parents to children. Differences in the way threats are perceived. Development and stress during childhood. Personality. What increases the risk? The following factors may make you more likely to develop this condition: Being female. Having a family history of anxiety disorders. Being very shy. Experiencing very stressful life events, such as the death of a loved one. Having a very stressful family environment. What are the signs or symptoms? People with GAD often worry excessively about many things in their lives, such as their health and family. Symptoms may also include: Mental and emotional symptoms: Worrying excessively about natural disasters. Fear of being late. Difficulty concentrating. Fears that others are judging your performance. Physical symptoms: Fatigue. Headaches, muscle tension, muscle twitches, trembling, or feeling shaky. Feeling like your heart is pounding or beating very fast. Feeling out of breath or like you cannot take a deep breath. Having trouble falling asleep or staying asleep, or experiencing restlessness. Sweating. Nausea, diarrhea, or irritable bowel syndrome (IBS). Behavioral symptoms: Experiencing erratic moods or irritability. Avoidance of new situations. Avoidance of people. Extreme difficulty making  decisions. How is this diagnosed? This condition is diagnosed based on your symptoms and medical history. You will also have a physical exam. Your health care provider may perform tests to rule out other possible causes of your symptoms. To be diagnosed with GAD, a person must have anxiety that: Is out of his or her control. Affects several different aspects of his or her life, such as work and relationships. Causes distress that makes him or her unable to take part in normal activities. Includes at least three symptoms of GAD, such as restlessness, fatigue, trouble concentrating, irritability, muscle tension, or sleep problems. Before your health care provider can confirm a diagnosis of GAD, these symptoms must be present more days than they are not, and they must last for 6 months or longer. How is this treated? This condition may be treated with: Medicine. Antidepressant medicine is usually prescribed for long-term daily control. Anti-anxiety medicines may be added in severe cases, especially when panic attacks occur. Talk therapy (psychotherapy). Certain types of talk therapy can be helpful in treating GAD by providing support, education, and guidance. Options include: Cognitive behavioral therapy (CBT). People learn coping skills and self-calming techniques to ease their physical symptoms. They learn to identify unrealistic thoughts and behaviors and to replace them with more appropriate thoughts and behaviors. Acceptance and commitment therapy (ACT). This treatment teaches people how to be mindful as a way to cope with unwanted thoughts and feelings. Biofeedback. This process trains you to manage your body's response (physiological response) through breathing techniques and relaxation methods. You will work with a therapist while machines are used to monitor your physical symptoms. Stress management techniques. These include yoga, meditation, and exercise. A mental health specialist can help  determine which treatment is best for you. Some people see improvement with one  type of therapy. However, other people require a combination of therapies. Follow these instructions at home: Lifestyle Maintain a consistent routine and schedule. Anticipate stressful situations. Create a plan and allow extra time to work with your plan. Practice stress management or self-calming techniques that you have learned from your therapist or your health care provider. Exercise regularly and spend time outdoors. Eat a healthy diet that includes plenty of vegetables, fruits, whole grains, low-fat dairy products, and lean protein. Do not eat a lot of foods that are high in fat, added sugar, or salt (sodium). Drink plenty of water. Avoid alcohol. Alcohol can increase anxiety. Avoid caffeine and certain over-the-counter cold medicines. These may make you feel worse. Ask your pharmacist which medicines to avoid. General instructions Take over-the-counter and prescription medicines only as told by your health care provider. Understand that you are likely to have setbacks. Accept this and be kind to yourself as you persist to take better care of yourself. Anticipate stressful situations. Create a plan and allow extra time to work with your plan. Recognize and accept your accomplishments, even if you judge them as small. Spend time with people who care about you. Keep all follow-up visits. This is important. Where to find more information [place]: https://carter.com/ Substance Abuse and Mental Health Services: ktimeonline.com Contact a health care provider if: Your symptoms do not get better. Your symptoms get worse. You have signs of depression, such as: A persistently sad or irritable mood. Loss of enjoyment in activities that used to bring you joy. Change in weight or eating. Changes in sleeping habits. Get help right away if: You have thoughts about hurting yourself or  others. If you ever feel like you may hurt yourself or others, or have thoughts about taking your own life, get help right away. Go to your nearest emergency department or: Call your local emergency services (911 in the [place] Call a suicide crisis helpline, such as the [place] at (478)719-3024 or 988 in the [place]. This is open 24 hours a day in the [place] Text the Crisis Text Line at 639-643-3018 (in the [place].). Summary Generalized anxiety disorder (GAD) is a mental health condition that involves worry that is not triggered by a specific event. People with GAD often worry excessively about many things in their lives, such as their health and family. GAD may cause symptoms such as restlessness, trouble concentrating, sleep problems, frequent sweating, nausea, diarrhea, headaches, and trembling or muscle twitching. A mental health specialist can help determine which treatment is best for you. Some people see improvement with one type of therapy. However, other people require a combination of therapies. This information is not intended to replace advice given to you by your health care provider. Make sure you discuss any questions you have with your health care provider. Document Revised: 04/16/2021 Document Reviewed: 01/12/2021 Elsevier Patient Education  [place]. Major Depressive Disorder, Adult Major depressive disorder is a mental health condition. This disorder affects feelings. It can also affect the body. Symptoms of this condition last most of the day, almost every day, for 2 weeks. This disorder can affect: Relationships. Daily activities, such as work and school. Activities that you normally like to do. What are the causes? The cause of this condition is not known. The disorder is likely caused by a mix of things, including: Your personality, such as being a shy person. Your behavior, or how you act toward others. Your thoughts and feelings. Too much  alcohol or drugs. How you react to stress. Health and mental problems that you have had for a long time. Things that hurt you in the past (trauma). Big changes in your life, such as divorce. What increases the risk? The following factors may make you more likely to develop this condition: Having family members with depression. Being a woman. Problems in the family. Low levels of some brain chemicals. Things that caused you pain as a child, especially if you lost a parent or were abused. A lot of stress in your life, such as from: Living without basic needs of life, such as food and shelter. Being treated poorly because of race, sex, or religion (discrimination). Health and mental problems that you have had for a long time. What are the signs or symptoms? The main symptoms of this condition are: Being sad all the time. Being grouchy all the time. Loss of interest in things and activities. Other symptoms include: Sleeping too much or too little. Eating too much or too little. Gaining or losing weight, without knowing why. Feeling tired or having low energy. Being restless and weak. Feeling hopeless, worthless, or guilty. Trouble thinking clearly or making decisions. Thoughts of hurting yourself or others, or thoughts of ending your life. Spending a lot of time alone. Inability to complete common tasks of daily life. If you have very bad MDD, you may: Believe things that are not true. Hear, see, taste, or feel things that are not there. Have mild depression that lasts for at least 2 years. Feel very sad and hopeless. Have trouble speaking or moving. How is this treated? This condition may be treated with: Talk therapy. This teaches you to know bad thoughts, feelings, and actions and how to change them. This can also help you to communicate with others. This can be done with members of your family. Medicines. These can be used to treat worry (anxiety), depression, or low levels  of chemicals in the brain. Lifestyle changes. You may need to: Limit alcohol use. Limit drug use. Get regular exercise. Get plenty of sleep. Make healthy eating choices. Spend more time outdoors. Brain stimulation. This treatment excites the brain. This is done when symptoms are very bad or have not gotten better with other treatments. Follow these instructions at home: Activity Get regular exercise as told. Spend time outdoors as told. Make time to do the things you enjoy. Find ways to deal with stress. Try to: Meditate. Do deep breathing. Spend time in nature. Keep a journal. Return to your normal activities as told by your doctor. Ask your doctor what activities are safe for you. Alcohol and drug use If you drink alcohol: Limit how much you use to: 0-1 drink a day for women. 0-2 drinks a day for men. Be aware of how much alcohol is in your drink. In the [place]., one drink equals one 12 oz bottle of beer (355 mL), one 5 oz glass of wine (148 mL), or one 1 oz glass of hard liquor (44 mL). Talk to your doctor about: Alcohol use. Alcohol can affect some medicines. Any drug use. General instructions  Take over-the-counter and prescription medicines and herbal preparations only as told by your doctor. Eat a healthy diet. Get a lot of sleep. Think about joining a support group. Your doctor may be able to suggest one. Keep all follow-up visits as told by your doctor. This is important. Where to find more information: [employer] on Mental Illness: www.nami.[place] of Mental  Health: https://carter.com/ American Psychiatric Association: www.psychiatry.org/patients-families/ Contact a doctor if: Your symptoms get worse. You get new symptoms. Get help right away if: You hurt yourself. You have serious thoughts about hurting yourself or others. You see, hear, taste, smell, or feel things that are not there. If you ever feel like you may hurt yourself or  others, or have thoughts about taking your own life, get help right away. Go to your nearest emergency department or: Call your local emergency services (911 in the [place] Call a suicide crisis helpline, such as the [place] at (864)218-5055 or 988 in the [place]. This is open 24 hours a day in the [place] Text the Crisis Text Line at 901-406-9620 (in the [place].). Summary Major depressive disorder is a mental health condition. This disorder affects feelings. Symptoms of this condition last most of the day, almost every day, for 2 weeks. The symptoms of this disorder can cause problems with relationships and with daily activities. There are treatments and support for people who get this disorder. You may need more than one type of treatment. Get help right away if you have serious thoughts about hurting yourself or others. This information is not intended to replace advice given to you by your health care provider. Make sure you discuss any questions you have with your health care provider. Document Revised: 04/16/2021 Document Reviewed: 09/02/2019 Elsevier Patient Education  [place].

## 2022-02-24 NOTE — Progress Notes (Signed)
Established Patient Office Visit  Subjective   Patient ID: Lindsay Sanders, female    DOB: [birth year]  Age: 31 y.o. MRN: [id number]  Chief Complaint  Patient presents with   Medication Problem    Medication is not working wants to discuss other options    HPI   Anxiety, Follow-up  She was last seen for anxiety 6 weeks ago. Changes made at last visit include Amitriptyline .   She reports poor compliance with treatment. She reports poor tolerance of treatment. She is having side effects. Excesive sleep and lack of motivation  She feels her anxiety is severe and Worse since last visit.  Symptoms: No chest pain No difficulty concentrating  Yes dizziness Yes fatigue  Yes feelings of losing control No insomnia  Yes irritable Yes palpitations  No panic attacks Yes racing thoughts  No shortness of breath No sweating  No tremors/shakes    GAD-7 Results    02/24/2022    8:21 AM 01/14/2022    9:46 AM 10/31/2021   11:00 AM  GAD-7 Generalized Anxiety Disorder Screening Tool  1. Feeling Nervous, Anxious, or on Edge 2 1 3   2. Not Being Able to Stop or Control Worrying 3 3 3   3. Worrying Too Much About Different Things 3 2 3   4. Trouble Relaxing 2 2 3   5. Being So Restless it's Hard To Sit Still 0 0 0  6. Becoming Easily Annoyed or Irritable 3 2 3   7. Feeling Afraid As If Something Awful Might Happen 0 0 1  Total GAD-7 Score 13 10 16   Difficulty At Work, Home, or Getting  Along With Others? Very difficult Somewhat difficult Extremely difficult    PHQ-9 Scores    02/24/2022    8:20 AM 01/14/2022    9:46 AM 10/31/2021   10:59 AM  PHQ9 SCORE ONLY  PHQ-9 Total Score 16 10 13     Depression, Follow-up  She  was last seen for this 6 weeks ago. Changes made at last visit include Amitriptyline  .   She reports poor compliance with treatment. She is having side effects. Worsening anxiety symptoms  She reports poor tolerance of treatment. Current symptoms include:  depressed mood, difficulty concentrating, and fatigue She feels she is Worse since last visit.     02/24/2022    8:20 AM 01/14/2022    9:46 AM 10/31/2021   10:59 AM  Depression screen PHQ 2/9  Decreased Interest 2 2 2   Down, Depressed, Hopeless 3 1 2   PHQ - 2 Score 5 3 4   Altered sleeping 2 2 2   Tired, decreased energy 3 3 2   Change in appetite 2 0 2  Feeling bad or failure about yourself  2 1 2   Trouble concentrating 2 1 1   Moving slowly or fidgety/restless 0 0 0  Suicidal thoughts  0 0  PHQ-9 Score 16 10 13   Difficult doing work/chores Extremely dIfficult Somewhat difficult Extremely dIfficult   Patient Active Problem List   Diagnosis Date Noted   Depression, recurrent (HCC) 02/24/2022   Body mass index (BMI) 37.0-37.9, adult 12/30/2021   Depression, major, single episode, moderate (HCC) 10/29/2021   Anxiety 10/29/2021   Establishing care with new doctor, encounter for 10/29/2021   Cellulitis 02/03/2021   Mixed anxiety and depressive disorder 03/31/2018   Surveillance of previously prescribed contraceptive pill 06/03/2006   Past Medical History:  Diagnosis Date   Anxiety    Depression    Hypertension    Past Surgical History:  Procedure Laterality Date   CESAREAN SECTION  2009   WISDOM TOOTH EXTRACTION  2011   Social History   Tobacco Use   Smoking status: Never   Smokeless tobacco: Never  Vaping Use   Vaping Use: Never used  Substance Use Topics   Alcohol use: Yes    Alcohol/week: 7.0 standard drinks    Types: 4 Cans of beer, 3 Shots of liquor per week   Drug use: Never   Social History   Socioeconomic History   Marital status: Single    Spouse name: Not on file   Number of children: 1   Years of education: 16   Highest education level: Associate degree: occupational, Scientist, product/process development, or vocational program  Occupational History   Not on file  Tobacco Use   Smoking status: Never   Smokeless tobacco: Never  Vaping Use   Vaping Use: Never used  Substance  and Sexual Activity   Alcohol use: Yes    Alcohol/week: 7.0 standard drinks    Types: 4 Cans of beer, 3 Shots of liquor per week   Drug use: Never   Sexual activity: Not Currently  Other Topics Concern   Not on file  Social History Narrative   Lives alone with son. Likes to go to pool. Go to beach when possible.    Social Determinants of Health   Financial Resource Strain: Not on file  Food Insecurity: Not on file  Transportation Needs: Not on file  Physical Activity: Not on file  Stress: Not on file  Social Connections: Not on file  Intimate Partner Violence: Not on file   Family Status  Relation Name Status   Mother  Alive   Father  Alive   Sister  Alive   Son  Alive   MGM  Alive   MGF  Deceased   PGM  Alive   PGF  Deceased   Family History  Problem Relation Age of Onset   Hypertension Mother    ADD / ADHD Son    Hypertension Maternal Grandmother    Arthritis Maternal Grandmother    Diabetes Paternal Grandfather    Allergies  Allergen Reactions   Lexapro [Escitalopram]       Review of Systems  Constitutional: Negative.   HENT: Negative.    Respiratory: Negative.    Cardiovascular: Negative.   Skin: Negative.  Negative for rash.  Psychiatric/Behavioral:  Positive for depression. The patient is nervous/anxious.   All other systems reviewed and are negative.    Objective:     BP 127/80   Pulse 69   Temp 97.7 F (36.5 C)   Ht 5\' 8"  (1.727 m)   Wt 254 lb (115.2 kg)   SpO2 98%   BMI 38.62 kg/m  BP Readings from Last 3 Encounters:  02/24/22 127/80  01/14/22 139/83  10/31/21 138/80   Wt Readings from Last 3 Encounters:  02/24/22 254 lb (115.2 kg)  01/14/22 252 lb (114.3 kg)  10/31/21 248 lb 6.4 oz (112.7 kg)      Physical Exam Vitals and nursing note reviewed.  Constitutional:      Appearance: Normal appearance. She is obese.  HENT:     Head: Normocephalic.     Right Ear: External ear normal.     Left Ear: External ear normal.      Nose: Nose normal.     Mouth/Throat:     Mouth: Mucous membranes are moist.     Pharynx: Oropharynx is clear.  Eyes:  Conjunctiva/sclera: Conjunctivae normal.  Musculoskeletal:        General: Normal range of motion.  Skin:    General: Skin is warm.     Findings: No rash.  Neurological:     Mental Status: She is alert and oriented to person, place, and time.  Psychiatric:        Attention and Perception: Attention and perception normal.        Mood and Affect: Mood is anxious and depressed.        Speech: Speech normal.        Behavior: Behavior is cooperative.        Thought Content: Thought content does not include suicidal ideation. Thought content does not include suicidal plan.     No results found for any visits on 02/24/22.  Last CBC Lab Results  Component Value Date   WBC 9.9 12/06/2019   HGB 14.0 12/06/2019   HCT 43.3 12/06/2019   MCV 93.9 12/06/2019   MCH 30.4 12/06/2019   RDW 13.0 12/06/2019   PLT 260 12/06/2019   Last metabolic panel Lab Results  Component Value Date   GLUCOSE 104 (H) 12/06/2019   NA 137 12/06/2019   K 3.7 12/06/2019   CL 102 12/06/2019   CO2 24 12/06/2019   BUN 14 12/06/2019   CREATININE 0.92 12/06/2019   GFRNONAA >60 12/06/2019   CALCIUM 9.0 12/06/2019   PROT 6.8 12/06/2019   ALBUMIN 3.8 12/06/2019   BILITOT 0.8 12/06/2019   ALKPHOS 58 12/06/2019   AST 40 12/06/2019   ALT 27 12/06/2019   ANIONGAP 11 12/06/2019   Last lipids No results found for: CHOL, HDL, LDLCALC, LDLDIRECT, TRIG, CHOLHDL    The ASCVD Risk score (Arnett DK, et al., 2019) failed to calculate for the following reasons:   The 2019 ASCVD risk score is only valid for ages 5740 to 779    Assessment & Plan:   Problem List Items Addressed This Visit       Other   Anxiety    Symptoms not well controlled, patient discontinued medication due to poor tolerance and side effects of worsening symptoms. Education provided to patient. Completed GAD-7 started patient  on pPrestiq 25 mg tablet daily, follow up in 6 weeks.   Based on [facility] assessment, patient is most likely to do better on this medication and a few others. Patient is also willing to go to psychiatry if all else fails.        Relevant Medications   desvenlafaxine (PRISTIQ) 25 MG 24 hr tablet   Depression, recurrent (HCC) - Primary    Symptoms not well controlled, patient discontinued medication due to poor tolerance and side effects of worsening symptoms. Education provided to patient. Completed PHQ-9 started patient on pPrestiq 25 mg tablet daily, follow up in 6 weeks.   Based on [facility] assessment, patient is most likely to do better on this medication and a few others. Patient is also willing to go to psychiatry if all else fails.        Relevant Medications   desvenlafaxine (PRISTIQ) 25 MG 24 hr tablet    Return in about 6 weeks (around 04/07/2022) for depression and anxiety.    Daryll Drownnyeje M Arnice Vanepps, NP

## 2022-02-24 NOTE — Assessment & Plan Note (Signed)
Symptoms not well controlled, patient discontinued medication due to poor tolerance and side effects of worsening symptoms. Education provided to patient. Completed GAD-7 started patient on pPrestiq 25 mg tablet daily, follow up in 6 weeks.   Based on [facility] assessment, patient is most likely to do better on this medication and a few others. Patient is also willing to go to psychiatry if all else fails.

## 2022-02-24 NOTE — Assessment & Plan Note (Signed)
Symptoms not well controlled, patient discontinued medication due to poor tolerance and side effects of worsening symptoms. Education provided to patient. Completed PHQ-9 started patient on pPrestiq 25 mg tablet daily, follow up in 6 weeks.   Based on [facility] assessment, patient is most likely to do better on this medication and a few others. Patient is also willing to go to psychiatry if all else fails.

## 2022-03-20 ENCOUNTER — Other Ambulatory Visit: Payer: Self-pay | Admitting: Nurse Practitioner

## 2022-03-20 ENCOUNTER — Other Ambulatory Visit: Payer: Self-pay

## 2022-03-20 DIAGNOSIS — F339 Major depressive disorder, recurrent, unspecified: Secondary | ICD-10-CM

## 2022-03-20 MED FILL — Desvenlafaxine Succinate Tab ER 24HR 25 MG (Base Equiv): ORAL | 30 days supply | Qty: 30 | Fill #0 | Status: AC

## 2022-03-23 ENCOUNTER — Other Ambulatory Visit: Payer: Self-pay

## 2022-03-30 ENCOUNTER — Other Ambulatory Visit: Payer: Self-pay

## 2022-04-06 ENCOUNTER — Telehealth: Payer: Self-pay

## 2022-04-06 NOTE — Telephone Encounter (Signed)
Tawnia Schirm (Key: [id number]) Rx #: [id number] [phone] 1MG /0.5ML auto-injectors   Form MedImpact ePA Form 2017 NCPDP Created 18 minutes ago Sent to Plan 1 minute ago Plan Response 1 minute ago Submit Clinical Questions less than a minute ago Determination Wait for Determination Please wait for MedImpact 2017 to return a determination.

## 2022-04-08 ENCOUNTER — Ambulatory Visit (INDEPENDENT_AMBULATORY_CARE_PROVIDER_SITE_OTHER): Payer: 59 | Admitting: Nurse Practitioner

## 2022-04-08 ENCOUNTER — Encounter: Payer: Self-pay | Admitting: Nurse Practitioner

## 2022-04-08 DIAGNOSIS — F419 Anxiety disorder, unspecified: Secondary | ICD-10-CM | POA: Diagnosis not present

## 2022-04-08 DIAGNOSIS — F339 Major depressive disorder, recurrent, unspecified: Secondary | ICD-10-CM

## 2022-04-08 NOTE — Progress Notes (Signed)
Established Patient Office Visit  Subjective   Patient ID: Lindsay Sanders, female    DOB: [birth year]  Age: 31 y.o. MRN: [id number]  Chief Complaint  Patient presents with   Medical Management of Chronic Issues   Anxiety    Doing better , states she has been able to see a difference still there some    Depression    Pt states she can tell a difference , it is still there sometimes     HPI Depression, Follow-up  She  was last seen for this 6 weeks ago. Changes made at last visit include Pristiq 25 mg tablet by mouth daily..   She reports good compliance with treatment. She is not having side effects.   She reports good tolerance of treatment. Current symptoms include: depressed mood She feels she is Improved since last visit.     04/08/2022    8:42 AM 02/24/2022    8:20 AM 01/14/2022    9:46 AM  Depression screen PHQ 2/9  Decreased Interest 1 2 2   Down, Depressed, Hopeless 1 3 1   PHQ - 2 Score 2 5 3   Altered sleeping 0 2 2  Tired, decreased energy 1 3 3   Change in appetite 1 2 0  Feeling bad or failure about yourself  2 2 1   Trouble concentrating 1 2 1   Moving slowly or fidgety/restless 0 0 0  Suicidal thoughts 0  0  PHQ-9 Score 7 16 10   Difficult doing work/chores Somewhat difficult Extremely dIfficult Somewhat difficult     Anxiety, Follow-up  She was last seen for anxiety 6 weeks ago. Changes made at last visit include Pristiq 25 mg tablet by mouth daily..   She reports good compliance with treatment. She reports good tolerance of treatment. She is not having side effects.   She feels her anxiety is moderate and Improved since last visit.  Symptoms: No chest pain No difficulty concentrating  No dizziness No fatigue  Yes feelings of losing control No insomnia  No irritable No palpitations  No panic attacks No racing thoughts  No shortness of breath No sweating  No tremors/shakes    GAD-7 Results    04/08/2022    8:42 AM 02/24/2022    8:21 AM  01/14/2022    9:46 AM  GAD-7 Generalized Anxiety Disorder Screening Tool  1. Feeling Nervous, Anxious, or on Edge 1 2 1   2. Not Being Able to Stop or Control Worrying 2 3 3   3. Worrying Too Much About Different Things 2 3 2   4. Trouble Relaxing 1 2 2   5. Being So Restless it's Hard To Sit Still 0 0 0  6. Becoming Easily Annoyed or Irritable 1 3 2   7. Feeling Afraid As If Something Awful Might Happen 0 0 0  Total GAD-7 Score 7 13 10   Difficulty At Work, Home, or Getting  Along With Others? Somewhat difficult Very difficult Somewhat difficult    PHQ-9 Scores    04/08/2022    8:42 AM 02/24/2022    8:20 AM 01/14/2022    9:46 AM  PHQ9 SCORE ONLY  PHQ-9 Total Score 7 16 10      Patient Active Problem List   Diagnosis Date Noted   Depression, recurrent (HCC) 02/24/2022   Body mass index (BMI) 37.0-37.9, adult 12/30/2021   Depression, major, single episode, moderate (HCC) 10/29/2021   Anxiety 10/29/2021   Establishing care with new doctor, encounter for 10/29/2021   Cellulitis 02/03/2021   Mixed anxiety and depressive  disorder 03/31/2018   Surveillance of previously prescribed contraceptive pill 06/03/2006   Past Medical History:  Diagnosis Date   Anxiety    Depression    Hypertension    Past Surgical History:  Procedure Laterality Date   CESAREAN SECTION  2009   WISDOM TOOTH EXTRACTION  2011   Social History   Tobacco Use   Smoking status: Never   Smokeless tobacco: Never  Vaping Use   Vaping Use: Never used  Substance Use Topics   Alcohol use: Yes    Alcohol/week: 7.0 standard drinks of alcohol    Types: 4 Cans of beer, 3 Shots of liquor per week   Drug use: Never   Social History   Socioeconomic History   Marital status: Single    Spouse name: Not on file   Number of children: 1   Years of education: 16   Highest education level: Associate degree: occupational, Scientist, product/process development, or vocational program  Occupational History   Not on file  Tobacco Use   Smoking  status: Never   Smokeless tobacco: Never  Vaping Use   Vaping Use: Never used  Substance and Sexual Activity   Alcohol use: Yes    Alcohol/week: 7.0 standard drinks of alcohol    Types: 4 Cans of beer, 3 Shots of liquor per week   Drug use: Never   Sexual activity: Not Currently  Other Topics Concern   Not on file  Social History Narrative   Lives alone with son. Likes to go to pool. Go to beach when possible.    Social Determinants of Health   Financial Resource Strain: Not on file  Food Insecurity: Not on file  Transportation Needs: Not on file  Physical Activity: Not on file  Stress: Not on file  Social Connections: Not on file  Intimate Partner Violence: Not on file   Family Status  Relation Name Status   Mother  Alive   Father  Alive   Sister  Alive   Son  Alive   MGM  Alive   MGF  Deceased   PGM  Alive   PGF  Deceased   Family History  Problem Relation Age of Onset   Hypertension Mother    ADD / ADHD Son    Hypertension Maternal Grandmother    Arthritis Maternal Grandmother    Diabetes Paternal Grandfather    Allergies  Allergen Reactions   Lexapro [Escitalopram]       Review of Systems  Constitutional: Negative.   HENT: Negative.    Eyes: Negative.   Respiratory: Negative.    Cardiovascular: Negative.   Genitourinary: Negative.   Skin: Negative.   Psychiatric/Behavioral:  Positive for depression. The patient is nervous/anxious.   All other systems reviewed and are negative.     Objective:     BP (!) 139/92   Pulse 77   Temp 98.7 F (37.1 C)   Ht 5\' 8"  (1.727 m)   Wt 250 lb (113.4 kg)   SpO2 100%   BMI 38.01 kg/m  BP Readings from Last 3 Encounters:  04/08/22 (!) 139/92  02/24/22 127/80  01/14/22 139/83   Wt Readings from Last 3 Encounters:  04/08/22 250 lb (113.4 kg)  02/24/22 254 lb (115.2 kg)  01/14/22 252 lb (114.3 kg)      Physical Exam Vitals and nursing note reviewed.  Constitutional:      Appearance: Normal  appearance.  HENT:     Head: Normocephalic.     Right Ear: External  ear normal.     Left Ear: External ear normal.     Nose: Nose normal.     Mouth/Throat:     Mouth: Mucous membranes are moist.     Pharynx: Oropharynx is clear.  Eyes:     Conjunctiva/sclera: Conjunctivae normal.  Cardiovascular:     Rate and Rhythm: Normal rate and regular rhythm.     Pulses: Normal pulses.     Heart sounds: Normal heart sounds.  Pulmonary:     Effort: Pulmonary effort is normal.     Breath sounds: Normal breath sounds.  Abdominal:     General: Bowel sounds are normal.  Musculoskeletal:        General: Normal range of motion.  Skin:    General: Skin is warm.     Findings: No rash.  Neurological:     General: No focal deficit present.     Mental Status: She is alert and oriented to person, place, and time.  Psychiatric:        Attention and Perception: Attention and perception normal.        Mood and Affect: Mood is anxious and depressed.        Speech: Speech normal.        Behavior: Behavior normal.      No results found for any visits on 04/08/22.     The ASCVD Risk score (Arnett DK, et al., 2019) failed to calculate for the following reasons:   The 2019 ASCVD risk score is only valid for ages 53 to 20    Assessment & Plan:   Problem List Items Addressed This Visit       Other   Anxiety    Patient is doing well on Pristiq 25 mg tablet by mouth daily.  Patient will follow-up in 6 weeks for total of 12 weeks to reassess if medication needs to be adjusted or dose increase.  Completed GAD-7.  Education provided printed handouts given.      Depression, recurrent (HCC)    Patient is doing well on Pristiq 25 mg tablet by mouth daily.  Patient will follow-up in 6 weeks for total of 12 weeks to reassess if medication needs to be adjusted or dose increase.  Completed PHQ-9.  Edu her she will cation provided printed handouts given.       Return in about 3 months (around  07/09/2022), or if symptoms worsen or fail to improve.    Daryll Drown, NP

## 2022-04-08 NOTE — Assessment & Plan Note (Signed)
Patient is doing well on Pristiq 25 mg tablet by mouth daily.  Patient will follow-up in 6 weeks for total of 12 weeks to reassess if medication needs to be adjusted or dose increase.  Completed PHQ-9.  Edu her she will cation provided printed handouts given.

## 2022-04-08 NOTE — Patient Instructions (Signed)
Generalized Anxiety Disorder, Adult Generalized anxiety disorder (GAD) is a mental health condition. Unlike normal worries, anxiety related to GAD is not triggered by a specific event. These worries do not fade or get better with time. GAD interferes with relationships, work, and school. GAD symptoms can vary from mild to severe. People with severe GAD can have intense waves of anxiety with physical symptoms that are similar to panic attacks. What are the causes? The exact cause of GAD is not known, but the following are believed to have an impact: Differences in natural brain chemicals. Genes passed down from parents to children. Differences in the way threats are perceived. Development and stress during childhood. Personality. What increases the risk? The following factors may make you more likely to develop this condition: Being female. Having a family history of anxiety disorders. Being very shy. Experiencing very stressful life events, such as the death of a loved one. Having a very stressful family environment. What are the signs or symptoms? People with GAD often worry excessively about many things in their lives, such as their health and family. Symptoms may also include: Mental and emotional symptoms: Worrying excessively about natural disasters. Fear of being late. Difficulty concentrating. Fears that others are judging your performance. Physical symptoms: Fatigue. Headaches, muscle tension, muscle twitches, trembling, or feeling shaky. Feeling like your heart is pounding or beating very fast. Feeling out of breath or like you cannot take a deep breath. Having trouble falling asleep or staying asleep, or experiencing restlessness. Sweating. Nausea, diarrhea, or irritable bowel syndrome (IBS). Behavioral symptoms: Experiencing erratic moods or irritability. Avoidance of new situations. Avoidance of people. Extreme difficulty making decisions. How is this diagnosed? This  condition is diagnosed based on your symptoms and medical history. You will also have a physical exam. Your health care provider may perform tests to rule out other possible causes of your symptoms. To be diagnosed with GAD, a person must have anxiety that: Is out of his or her control. Affects several different aspects of his or her life, such as work and relationships. Causes distress that makes him or her unable to take part in normal activities. Includes at least three symptoms of GAD, such as restlessness, fatigue, trouble concentrating, irritability, muscle tension, or sleep problems. Before your health care provider can confirm a diagnosis of GAD, these symptoms must be present more days than they are not, and they must last for 6 months or longer. How is this treated? This condition may be treated with: Medicine. Antidepressant medicine is usually prescribed for Certain-term daily control. Anti-anxiety medicines may be added in severe cases, especially when panic attacks occur. Talk therapy (psychotherapy). Certain types of talk therapy can be helpful in treating GAD by providing support, education, and guidance. Options include: Cognitive behavioral therapy (CBT). People learn coping skills and self-calming techniques to ease their physical symptoms. They learn to identify unrealistic thoughts and behaviors and to replace them with more appropriate thoughts and behaviors. Acceptance and commitment therapy (ACT). This treatment teaches people how to be mindful as a way to cope with unwanted thoughts and feelings. Biofeedback. This process trains you to manage your body's response (physiological response) through breathing techniques and relaxation methods. You will work with a therapist while machines are used to monitor your physical symptoms. Stress management techniques. These include yoga, meditation, and exercise. A mental health specialist can help determine which treatment is best for you.  Some people see improvement with one type of therapy. However, other people require   a combination of therapies. Follow these instructions at home: Lifestyle Maintain a consistent routine and schedule. Anticipate stressful situations. Create a plan and allow extra time to work with your plan. Practice stress management or self-calming techniques that you have learned from your therapist or your health care provider. Exercise regularly and spend time outdoors. Eat a healthy diet that includes plenty of vegetables, fruits, whole grains, low-fat dairy products, and lean protein. Do not eat a lot of foods that are high in fat, added sugar, or salt (sodium). Drink plenty of water. Avoid alcohol. Alcohol can increase anxiety. Avoid caffeine and certain over-the-counter cold medicines. These may make you feel worse. Ask your pharmacist which medicines to avoid. General instructions Take over-the-counter and prescription medicines only as told by your health care provider. Understand that you are likely to have setbacks. Accept this and be kind to yourself as you persist to take better care of yourself. Anticipate stressful situations. Create a plan and allow extra time to work with your plan. Recognize and accept your accomplishments, even if you judge them as small. Spend time with people who care about you. Keep all follow-up visits. This is important. Where to find more information National Institute of Mental Health: www.nimh.nih.gov Substance Abuse and Mental Health Services: www.samhsa.gov Contact a health care provider if: Your symptoms do not get better. Your symptoms get worse. You have signs of depression, such as: A persistently sad or irritable mood. Loss of enjoyment in activities that used to bring you joy. Change in weight or eating. Changes in sleeping habits. Get help right away if: You have thoughts about hurting yourself or others. If you ever feel like you may hurt  yourself or others, or have thoughts about taking your own life, get help right away. Go to your nearest emergency department or: Call your local emergency services (911 in the [place] Call a suicide crisis helpline, such as the National Suicide Prevention Lifeline at 1-800-273-8255 or 988 in the [place] This is open 24 hours a day in the [place] Text the Crisis Text Line at 741741 (in the [place] Summary Generalized anxiety disorder (GAD) is a mental health condition that involves worry that is not triggered by a specific event. People with GAD often worry excessively about many things in their lives, such as their health and family. GAD may cause symptoms such as restlessness, trouble concentrating, sleep problems, frequent sweating, nausea, diarrhea, headaches, and trembling or muscle twitching. A mental health specialist can help determine which treatment is best for you. Some people see improvement with one type of therapy. However, other people require a combination of therapies. This information is not intended to replace advice given to you by your health care provider. Make sure you discuss any questions you have with your health care provider. Document Revised: 04/16/2021 Document Reviewed: 01/12/2021 Elsevier Patient Education  2023 Elsevier Inc. Major Depressive Disorder, Adult Major depressive disorder is a mental health condition. This disorder affects feelings. It can also affect the body. Symptoms of this condition last most of the day, almost every day, for 2 weeks. This disorder can affect: Relationships. Daily activities, such as work and school. Activities that you normally like to do. What are the causes? The cause of this condition is not known. The disorder is likely caused by a mix of things, including: Your personality, such as being a shy person. Your behavior, or how you act toward others. Your thoughts and feelings. Too much alcohol or drugs. How you react to    stress. Health and mental problems that you have had for a long time. Things that hurt you in the past (trauma). Big changes in your life, such as divorce. What increases the risk? The following factors may make you more likely to develop this condition: Having family members with depression. Being a woman. Problems in the family. Low levels of some brain chemicals. Things that caused you pain as a child, especially if you lost a parent or were abused. A lot of stress in your life, such as from: Living without basic needs of life, such as food and shelter. Being treated poorly because of race, sex, or religion (discrimination). Health and mental problems that you have had for a long time. What are the signs or symptoms? The main symptoms of this condition are: Being sad all the time. Being grouchy all the time. Loss of interest in things and activities. Other symptoms include: Sleeping too much or too little. Eating too much or too little. Gaining or losing weight, without knowing why. Feeling tired or having low energy. Being restless and weak. Feeling hopeless, worthless, or guilty. Trouble thinking clearly or making decisions. Thoughts of hurting yourself or others, or thoughts of ending your life. Spending a lot of time alone. Inability to complete common tasks of daily life. If you have very bad MDD, you may: Believe things that are not true. Hear, see, taste, or feel things that are not there. Have mild depression that lasts for at least 2 years. Feel very sad and hopeless. Have trouble speaking or moving. How is this treated? This condition may be treated with: Talk therapy. This teaches you to know bad thoughts, feelings, and actions and how to change them. This can also help you to communicate with others. This can be done with members of your family. Medicines. These can be used to treat worry (anxiety), depression, or low levels of chemicals in the  brain. Lifestyle changes. You may need to: Limit alcohol use. Limit drug use. Get regular exercise. Get plenty of sleep. Make healthy eating choices. Spend more time outdoors. Brain stimulation. This treatment excites the brain. This is done when symptoms are very bad or have not gotten better with other treatments. Follow these instructions at home: Activity Get regular exercise as told. Spend time outdoors as told. Make time to do the things you enjoy. Find ways to deal with stress. Try to: Meditate. Do deep breathing. Spend time in nature. Keep a journal. Return to your normal activities as told by your doctor. Ask your doctor what activities are safe for you. Alcohol and drug use If you drink alcohol: Limit how much you use to: 0-1 drink a day for women. 0-2 drinks a day for men. Be aware of how much alcohol is in your drink. In the [place] one drink equals one 12 oz bottle of beer (355 mL), one 5 oz glass of wine (148 mL), or one 1 oz glass of hard liquor (44 mL). Talk to your doctor about: Alcohol use. Alcohol can affect some medicines. Any drug use. General instructions  Take over-the-counter and prescription medicines and herbal preparations only as told by your doctor. Eat a healthy diet. Get a lot of sleep. Think about joining a support group. Your doctor may be able to suggest one. Keep all follow-up visits as told by your doctor. This is important. Where to find more information: [employer] on Mental Illness: www.nami.[place]: https://carter.com/ American Psychiatric Association: www.psychiatry.org/patients-families/ Contact  a doctor if: Your symptoms get worse. You get new symptoms. Get help right away if: You hurt yourself. You have serious thoughts about hurting yourself or others. You see, hear, taste, smell, or feel things that are not there. If you ever feel like you may hurt yourself or others, or have thoughts  about taking your own life, get help right away. Go to your nearest emergency department or: Call your local emergency services (911 in the [place] Call a suicide crisis helpline, such as the [facility] at 434-118-6762 or 988 in the [place]. This is open 24 hours a day in the [place] Text the Crisis Text Line at 904-001-1521 (in the [place].). Summary Major depressive disorder is a mental health condition. This disorder affects feelings. Symptoms of this condition last most of the day, almost every day, for 2 weeks. The symptoms of this disorder can cause problems with relationships and with daily activities. There are treatments and support for people who get this disorder. You may need more than one type of treatment. Get help right away if you have serious thoughts about hurting yourself or others. This information is not intended to replace advice given to you by your health care provider. Make sure you discuss any questions you have with your health care provider. Document Revised: 04/16/2021 Document Reviewed: 09/02/2019 Elsevier Patient Education  [place].

## 2022-04-08 NOTE — Assessment & Plan Note (Signed)
Patient is doing well on Pristiq 25 mg tablet by mouth daily.  Patient will follow-up in 6 weeks for total of 12 weeks to reassess if medication needs to be adjusted or dose increase.  Completed GAD-7.  Education provided printed handouts given.

## 2022-04-09 NOTE — Telephone Encounter (Signed)
Lindsay Sanders (Key: [id number]) Rx #: [id number] [phone] 1MG /0.5ML auto-injectors   Form Med[employer] ePA Form 2017 NCPDP Created 3 days ago Sent to Plan 3 days ago Plan Response 3 days ago Submit Clinical Questions 3 days ago Determination Favorable 2 hours ago Your prior authorization for 2018 has been approved! MORE INFO Personalized support and financial assistance may be available through the [facility] program. For more information, and to see program requirements, click on the More Info button to the right.  Message from plan: The request has been approved. The authorization is effective for a maximum of 12 fills from 04/09/2022 to 04/09/2023, as long as the member is enrolled in their current health plan. The request was approved as submitted. This request has been approved for 65mL per 28 days.The following prior authorizations have also been entered effective 04/09/2022 through 04/09/2023: 06/10/2023 0.25mg /0.64mL, allowing 50mL per 28 days with 12 fills; please reference authorization number [id number]. Wegovy 0.5mg /0.80mL, allowing 6mL per 28 days with 12 fills; please reference authorization number [id number]. Wegovy 1.7mg /0.32mL, allowing 35mL per 28 days with 12 fills; please reference authorization number [id number]. Wegovy 2.4mg /0.55mL, allowing 51mL per 28 days with 12 fills; please reference authorization number [id number]. A written notification letter will follow with additional details.  Patient and pharmacy informed.  Patient also told about coupon savings card.

## 2022-04-27 ENCOUNTER — Other Ambulatory Visit: Payer: Self-pay

## 2022-04-28 ENCOUNTER — Other Ambulatory Visit: Payer: Self-pay

## 2022-05-01 ENCOUNTER — Encounter: Payer: Self-pay | Admitting: Nurse Practitioner

## 2022-05-03 ENCOUNTER — Other Ambulatory Visit: Payer: Self-pay | Admitting: Nurse Practitioner

## 2022-05-03 DIAGNOSIS — F339 Major depressive disorder, recurrent, unspecified: Secondary | ICD-10-CM

## 2022-05-03 MED ORDER — DESVENLAFAXINE SUCCINATE ER 50 MG PO TB24: 50.0000 mg | ORAL_TABLET | Freq: Every day | ORAL | 2 refills | Status: DC

## 2022-05-03 NOTE — Telephone Encounter (Signed)
I sent 50 mg tablet by mouth daily to pharmacy, follow up in 6 weeks

## 2022-05-03 NOTE — Telephone Encounter (Signed)
Increased dose to 50 mg tablet by mouth daily, follow up in 6 weeks

## 2022-05-14 DIAGNOSIS — F3281 Premenstrual dysphoric disorder: Secondary | ICD-10-CM | POA: Diagnosis not present

## 2022-05-14 DIAGNOSIS — N898 Other specified noninflammatory disorders of vagina: Secondary | ICD-10-CM | POA: Diagnosis not present

## 2022-05-14 DIAGNOSIS — N926 Irregular menstruation, unspecified: Secondary | ICD-10-CM | POA: Diagnosis not present

## 2022-05-18 ENCOUNTER — Other Ambulatory Visit: Payer: Self-pay | Admitting: Nurse Practitioner

## 2022-05-18 DIAGNOSIS — F339 Major depressive disorder, recurrent, unspecified: Secondary | ICD-10-CM

## 2022-05-19 ENCOUNTER — Other Ambulatory Visit: Payer: Self-pay

## 2022-05-19 MED ORDER — DESVENLAFAXINE SUCCINATE ER 50 MG PO TB24
ORAL_TABLET | ORAL | 0 refills | Status: AC
  Filled 2022-05-19: qty 90, 90d supply, fill #0

## 2022-05-20 ENCOUNTER — Ambulatory Visit: Payer: 59 | Admitting: Nurse Practitioner

## 2022-06-01 ENCOUNTER — Telehealth: Payer: Self-pay | Admitting: Physician Assistant

## 2022-06-01 DIAGNOSIS — L03319 Cellulitis of trunk, unspecified: Secondary | ICD-10-CM

## 2022-06-01 DIAGNOSIS — L02219 Cutaneous abscess of trunk, unspecified: Secondary | ICD-10-CM

## 2022-06-01 MED ORDER — SULFAMETHOXAZOLE-TRIMETHOPRIM 800-160 MG PO TABS: 1.0000 | ORAL_TABLET | Freq: Two times a day (BID) | ORAL | 0 refills | Status: DC

## 2022-06-01 MED ORDER — FLUCONAZOLE 150 MG PO TABS: 150.0000 mg | ORAL_TABLET | ORAL | 0 refills | Status: DC | PRN

## 2022-06-01 NOTE — Addendum Note (Signed)
Addended by: Jannifer Rodney A on: 06/01/2022 07:57 PM   Modules accepted: Orders

## 2022-06-01 NOTE — Progress Notes (Signed)
I have spent 5 minutes in review of e-visit questionnaire, review and updating patient chart, medical decision making and response to patient.   Kalenna Millett Cody Mardie Kellen, PA-C    

## 2022-06-01 NOTE — Progress Notes (Signed)
E Visit for Cellulitis ° °We are sorry that you are not feeling well. Here is how we plan to help! ° °Based on what you shared with me it looks like you have cellulitis.  Cellulitis looks like areas of skin redness, swelling, and warmth; it develops as a result of bacteria entering under the skin. Little red spots and/or bleeding can be seen in skin, and tiny surface sacs containing fluid can occur. Fever can be present. Cellulitis is almost always on one side of a body, and the lower limbs are the most common site of involvement.  ° °I have prescribed:  Bactrim DS 1 tablet by mouth twice a day for 7 days ° °HOME CARE: ° °Take your medications as ordered and take all of them, even if the skin irritation appears to be healing.  ° °GET HELP RIGHT AWAY IF: ° °Symptoms that don't begin to go away within 48 hours. °Severe redness persists or worsens °If the area turns color, spreads or swells. °If it blisters and opens, develops yellow-brown crust or bleeds. °You develop a fever or chills. °If the pain increases or becomes unbearable.  °Are unable to keep fluids and food down. ° °MAKE SURE YOU  ° °Understand these instructions. °Will watch your condition. °Will get help right away if you are not doing well or get worse. ° °Thank you for choosing an e-visit. ° °Your e-visit answers were reviewed by a board certified advanced clinical practitioner to complete your personal care plan. Depending upon the condition, your plan could have included both over the counter or prescription medications. ° °Please review your pharmacy choice. Make sure the pharmacy is open so you can pick up prescription now. If there is a problem, you may contact your provider through MyChart messaging and have the prescription routed to another pharmacy.  Your safety is important to us. If you have drug allergies check your prescription carefully.  ° °For the next 24 hours you can use MyChart to ask questions about today's visit, request a  non-urgent call back, or ask for a work or school excuse. °You will get an email in the next two days asking about your experience. I hope that your e-visit has been valuable and will speed your recovery. ° °

## 2022-06-03 ENCOUNTER — Ambulatory Visit: Payer: 59 | Admitting: Nurse Practitioner

## 2022-06-03 ENCOUNTER — Encounter: Payer: Self-pay | Admitting: Nurse Practitioner

## 2022-06-03 VITALS — BP 148/96 | HR 83 | Temp 98.5°F | Ht 68.0 in | Wt 241.0 lb

## 2022-06-03 DIAGNOSIS — L0291 Cutaneous abscess, unspecified: Secondary | ICD-10-CM

## 2022-06-03 NOTE — Progress Notes (Signed)
Acute Office Visit  Subjective:     Patient ID: Lindsay Sanders, female    DOB: 1991/09/30, 31 y.o.   MRN: [id number]  Chief Complaint  Patient presents with   skin check    Spot on left leg    For  Recurrent Abscess: Patient presents for evaluation of a cutaneous abscess. Lesion is located in the bilateral groin area. Onset was a few days ago. Symptoms have gradually improved. With a new spot in 24 hours. Abscess has associated symptoms of spontaneous drainage. Patient does have previous history of cutaneous abscesses. Patient does not have diabetes.      Review of Systems  Constitutional: Negative.   HENT: Negative.    Eyes: Negative.   Respiratory: Negative.  Negative for cough.   Cardiovascular: Negative.   Genitourinary: Negative.   Skin:  Positive for itching and rash.       Bilateral abscess  All other systems reviewed and are negative.       Objective:    BP (!) 148/96   Pulse 83   Temp 98.5 F (36.9 C)   Ht 5\' 8"  (1.727 m)   Wt 241 lb (109.3 kg)   SpO2 100%   BMI 36.64 kg/m  BP Readings from Last 3 Encounters:  06/03/22 (!) 148/96  04/08/22 (!) 139/92  02/24/22 127/80   Wt Readings from Last 3 Encounters:  06/03/22 241 lb (109.3 kg)  04/08/22 250 lb (113.4 kg)  02/24/22 254 lb (115.2 kg)      Physical Exam Vitals and nursing note reviewed.  Constitutional:      Appearance: Normal appearance.  HENT:     Head: Normocephalic.     Right Ear: External ear normal.     Left Ear: External ear normal.     Nose: Nose normal.     Mouth/Throat:     Mouth: Mucous membranes are moist.     Pharynx: Oropharynx is clear.  Cardiovascular:     Rate and Rhythm: Normal rate and regular rhythm.     Pulses: Normal pulses.  Pulmonary:     Effort: Pulmonary effort is normal.     Breath sounds: Normal breath sounds.  Abdominal:     General: Bowel sounds are normal.  Skin:    Findings: Erythema and rash present.          Comments: Abscess groin area.   Neurological:     Mental Status: She is alert and oriented to person, place, and time.     No results found for any visits on 06/03/22.      Assessment & Plan:  We reviewed the etiology of recurrent abscesses of skin.  Skin abscesses are collections of pus within the dermis and deeper skin tissues. Skin abscesses manifest as painful, tender, fluctuant, and erythematous nodules, frequently surmounted by a pustule and surrounded by a rim of erythematous swelling.  Spontaneous drainage of purulent material may occur.  Fever can occur on occasion.   -Skin abscesses can develop in healthy individuals with no predisposing conditions other than skin or nasal carriage of Staphylococcus aureus.  Individuals in close contact with others who have active infection with skin abscesses are at increased risk.  Any process leading to a breach in the skin barrier can also predispose to the development of a skin abscesses, such as atopic dermatitis.  Shaving with blades causing ingrown hair.   Continue current antibiotics (Bactrim) prescribed by tele doc. Avoid tight clothing and under wear around area, keep skin  dry and completed medication as prescribed. Follow up with worsening unresolved symptoms.   Problem List Items Addressed This Visit   None Visit Diagnoses     Abscess    -  Primary       No orders of the defined types were placed in this encounter.   Return if symptoms worsen or fail to improve, for Abscess.  Daryll Drown, NP

## 2022-06-03 NOTE — Patient Instructions (Signed)
Skin Abscess  A skin abscess is an infected area of your skin that contains pus and other material. An abscess can happen in any part of your body. Some abscesses break open (rupture) on their own. Most continue to get worse unless they are treated. The infection can spread deeper into the body and into your blood, which can make you feel sick. A skin abscess is caused by germs that enter the skin through a cut or scrape. It can also be caused by blocked oil and sweat glands or infected hair follicles. This condition is usually treated by: Draining the pus. Taking antibiotic medicines. Placing a warm, wet washcloth over the abscess. Follow these instructions at home: Medicines  Take over-the-counter and prescription medicines only as told by your doctor. If you were prescribed an antibiotic medicine, take it as told by your doctor. Do not stop taking the antibiotic even if you start to feel better. Abscess care  If you have an abscess that has not drained, place a warm, clean, wet washcloth over the abscess several times a day. Do this as told by your doctor. Follow instructions from your doctor about how to take care of your abscess. Make sure you: Cover the abscess with a bandage (dressing). Change your bandage or gauze as told by your doctor. Wash your hands with soap and water before you change the bandage or gauze. If you cannot use soap and water, use hand sanitizer. Check your abscess every day for signs that the infection is getting worse. Check for: More redness, swelling, or pain. More fluid or blood. Warmth. More pus or a bad smell. General instructions To avoid spreading the infection: Do not share personal care items, towels, or hot tubs with others. Avoid making skin-to-skin contact with other people. Keep all follow-up visits as told by your doctor. This is important. Contact a doctor if: You have more redness, swelling, or pain around your abscess. You have more fluid  or blood coming from your abscess. Your abscess feels warm when you touch it. You have more pus or a bad smell coming from your abscess. Your muscles ache. You feel sick. Get help right away if: You have very bad (severe) pain. You see red streaks on your skin spreading away from the abscess. You see redness that spreads quickly. You have a fever or chills. Summary A skin abscess is an infected area of your skin that contains pus and other material. The abscess is caused by germs that enter the skin through a cut or scrape. It can also be caused by blocked oil and sweat glands or infected hair follicles. Follow your doctor's instructions on caring for your abscess, taking medicines, preventing infections, and keeping follow-up visits. This information is not intended to replace advice given to you by your health care provider. Make sure you discuss any questions you have with your health care provider. Document Revised: 12/25/2021 Document Reviewed: 06/30/2021 Elsevier Patient Education  2023 Elsevier Inc.  

## 2022-06-04 ENCOUNTER — Telehealth: Payer: Self-pay | Admitting: Nurse Practitioner

## 2022-06-04 NOTE — Telephone Encounter (Signed)
Left detailed message per signed DPR. Encouraged call back and schedule an appointment for a UTI evaluation

## 2022-06-05 ENCOUNTER — Encounter: Payer: Self-pay | Admitting: Family Medicine

## 2022-06-05 ENCOUNTER — Other Ambulatory Visit (HOSPITAL_COMMUNITY)
Admission: RE | Admit: 2022-06-05 | Discharge: 2022-06-05 | Disposition: A | Discharge status: Home / Self Care | Payer: 59 | Source: Ambulatory Visit | Attending: Family Medicine | Admitting: Family Medicine

## 2022-06-05 ENCOUNTER — Telehealth: Payer: Self-pay | Admitting: *Deleted

## 2022-06-05 ENCOUNTER — Ambulatory Visit: Payer: 59 | Admitting: Family Medicine

## 2022-06-05 VITALS — BP 139/88 | HR 89 | Temp 98.5°F | Ht 68.0 in | Wt 241.0 lb

## 2022-06-05 DIAGNOSIS — Z8619 Personal history of other infectious and parasitic diseases: Secondary | ICD-10-CM

## 2022-06-05 DIAGNOSIS — L0291 Cutaneous abscess, unspecified: Secondary | ICD-10-CM | POA: Diagnosis not present

## 2022-06-05 DIAGNOSIS — N760 Acute vaginitis: Secondary | ICD-10-CM

## 2022-06-05 DIAGNOSIS — N898 Other specified noninflammatory disorders of vagina: Secondary | ICD-10-CM

## 2022-06-05 DIAGNOSIS — R300 Dysuria: Secondary | ICD-10-CM

## 2022-06-05 LAB — WET PREP FOR TRICH, YEAST, CLUE
Clue Cell Exam: NEGATIVE
Trichomonas Exam: NEGATIVE
Yeast Exam: NEGATIVE

## 2022-06-05 LAB — MICROSCOPIC EXAMINATION
Renal Epithel, UA: NONE SEEN /hpf
WBC, UA: >30 /hpf — AB (ref 0–5)

## 2022-06-05 LAB — URINALYSIS, ROUTINE W REFLEX MICROSCOPIC
Bilirubin, UA: NEGATIVE
Glucose, UA: NEGATIVE
Ketones, UA: NEGATIVE
Nitrite, UA: NEGATIVE
Protein,UA: NEGATIVE
Specific Gravity, UA: 1.01 (ref 1.005–1.030)
Urobilinogen, Ur: 0.2 mg/dL (ref 0.2–1.0)
pH, UA: 6 (ref 5.0–7.5)

## 2022-06-05 MED ORDER — FLUCONAZOLE 150 MG PO TABS: ORAL_TABLET | ORAL | 0 refills | Status: DC

## 2022-06-05 MED ORDER — DOXYCYCLINE HYCLATE 100 MG PO TABS: 100.0000 mg | ORAL_TABLET | Freq: Two times a day (BID) | ORAL | 0 refills | Status: AC

## 2022-06-05 NOTE — Progress Notes (Signed)
Subjective:  Patient ID: Lindsay Sanders, female    DOB: 1991/10/05, 31 y.o.   MRN: [id number]  Patient Care Team: Daryll Drown, NP as PCP - General (Nurse Practitioner)   Chief Complaint:  Vaginal Itching ([place], swelling, discharge, hurts to urinate)   HPI: Lindsay Sanders is a 31 y.o. female presenting on 06/05/2022 for Vaginal Itching ([place], swelling, discharge, hurts to urinate)   Pt presents today with complaints of worsening vaginal irritation and discharge after starting bactrim for an abscess. States bactrim always causes significant yeast infections. She was given a diflucan to take for the yeast infection but reports it is worsening. She stopped the bactrim due to the worsening yeast. The abscess to her right groin is still present.   Vaginal Itching The patient's primary symptoms include genital itching and vaginal discharge. The patient's pertinent negatives include no genital lesions, genital odor, genital rash, missed menses, pelvic pain or vaginal bleeding. This is a recurrent problem. The current episode started 1 to 4 weeks ago. The problem occurs constantly. The problem has been gradually worsening. The pain is moderate. The problem affects both sides. She is not pregnant. Associated symptoms include dysuria. Pertinent negatives include no abdominal pain, anorexia, back pain, chills, constipation, diarrhea, discolored urine, fever, flank pain, frequency, headaches, hematuria, joint pain, joint swelling, nausea, painful intercourse, rash, sore throat, urgency or vomiting. The vaginal discharge was yellow, thick and copious. There has been no bleeding. The symptoms are aggravated by tactile pressure, urinating and activity. She has tried antifungals for the symptoms. The treatment provided no relief. She is sexually active. No, her partner does not have an STD.     Relevant past medical, surgical, family, and social history reviewed and updated as indicated.   Allergies and medications reviewed and updated. Data reviewed: Chart in Epic.   Past Medical History:  Diagnosis Date   Anxiety    Depression    Hypertension     Past Surgical History:  Procedure Laterality Date   CESAREAN SECTION  2009   WISDOM TOOTH EXTRACTION  2011    Social History   Socioeconomic History   Marital status: Single    Spouse name: Not on file   Number of children: 1   Years of education: 16   Highest education level: Associate degree: occupational, Scientist, product/process development, or vocational program  Occupational History   Not on file  Tobacco Use   Smoking status: Never   Smokeless tobacco: Never  Vaping Use   Vaping Use: Never used  Substance and Sexual Activity   Alcohol use: Yes    Alcohol/week: 7.0 standard drinks of alcohol    Types: 4 Cans of beer, 3 Shots of liquor per week   Drug use: Never   Sexual activity: Not Currently  Other Topics Concern   Not on file  Social History Narrative   Lives alone with son. Likes to go to pool. Go to beach when possible.    Social Determinants of Health   Financial Resource Strain: Not on file  Food Insecurity: Not on file  Transportation Needs: Not on file  Physical Activity: Not on file  Stress: Not on file  Social Connections: Not on file  Intimate Partner Violence: Not on file    Outpatient Encounter Medications as of 06/05/2022  Medication Sig   doxycycline (VIBRA-TABS) 100 MG tablet Take 1 tablet (100 mg total) by mouth 2 (two) times daily for 10 days. 1 po bid   fluconazole (DIFLUCAN) 150 MG  tablet 1 po q week x 4 weeks   [DISCONTINUED] fluconazole (DIFLUCAN) 150 MG tablet Take 1 tablet (150 mg total) by mouth every three (3) days as needed.   desvenlafaxine (PRISTIQ) 50 MG 24 hr tablet TAKE 1 TABLET BY MOUTH EVERY DAY (Patient not taking: Reported on 06/05/2022)   desvenlafaxine (PRISTIQ) 50 MG 24 hr tablet Take 1 tablet by mouth once daily (Patient not taking: Reported on 06/05/2022)   [DISCONTINUED]  sulfamethoxazole-trimethoprim (BACTRIM DS) 800-160 MG tablet Take 1 tablet by mouth 2 (two) times daily. (Patient not taking: Reported on 06/05/2022)   No facility-administered encounter medications on file as of 06/05/2022.    Allergies  Allergen Reactions   Lexapro [Escitalopram]     Review of Systems  Constitutional:  Negative for activity change, appetite change, chills, diaphoresis, fatigue, fever and unexpected weight change.  HENT:  Negative for sore throat.   Respiratory:  Negative for cough and shortness of breath.   Cardiovascular:  Negative for chest pain, palpitations and leg swelling.  Gastrointestinal:  Negative for abdominal pain, anorexia, constipation, diarrhea, nausea and vomiting.  Genitourinary:  Positive for dysuria, vaginal discharge and vaginal pain. Negative for difficulty urinating, flank pain, frequency, hematuria, missed menses, pelvic pain, urgency and vaginal bleeding.  Musculoskeletal:  Negative for back pain, joint pain and joint swelling.  Skin:  Positive for wound. Negative for rash.  Neurological:  Negative for dizziness, tremors, seizures, syncope, facial asymmetry, speech difficulty, weakness, light-headedness, numbness and headaches.  All other systems reviewed and are negative.       Objective:  BP 139/88   Pulse 89   Temp 98.5 F (36.9 C)   Ht 5\' 8"  (1.727 m)   Wt 241 lb (109.3 kg)   SpO2 97%   BMI 36.64 kg/m    Wt Readings from Last 3 Encounters:  06/05/22 241 lb (109.3 kg)  06/03/22 241 lb (109.3 kg)  04/08/22 250 lb (113.4 kg)    Physical Exam Vitals and nursing note reviewed.  Constitutional:      General: She is not in acute distress.    Appearance: Normal appearance. She is obese. She is not ill-appearing, toxic-appearing or diaphoretic.  HENT:     Head: Normocephalic and atraumatic.     Mouth/Throat:     Mouth: Mucous membranes are moist.  Eyes:     Conjunctiva/sclera: Conjunctivae normal.     Pupils: Pupils are equal,  round, and reactive to light.  Cardiovascular:     Rate and Rhythm: Normal rate and regular rhythm.     Heart sounds: Normal heart sounds.  Pulmonary:     Effort: Pulmonary effort is normal.     Breath sounds: Normal breath sounds.  Skin:    General: Skin is warm and dry.     Capillary Refill: Capillary refill takes less than 2 seconds.       Neurological:     General: No focal deficit present.     Mental Status: She is alert and oriented to person, place, and time.     Results for orders placed or performed in visit on 01/14/22  Urine Culture   Specimen: Urine   UR  Result Value Ref Range   Urine Culture, Routine Final report    Organism ID, Bacteria Comment   Urinalysis, Routine w reflex microscopic  Result Value Ref Range   Specific Gravity, UA <1.005 (L) 1.005 - 1.030   pH, UA 6.0 5.0 - 7.5   Color, UA Yellow Yellow  Appearance Ur Clear Clear   Leukocytes,UA Negative Negative   Protein,UA Negative Negative/Trace   Glucose, UA Negative Negative   Ketones, UA Negative Negative   RBC, UA Negative Negative   Bilirubin, UA Negative Negative   Urobilinogen, Ur 0.2 0.2 - 1.0 mg/dL   Nitrite, UA Negative Negative       Pertinent labs & imaging results that were available during my care of the patient were reviewed by me and considered in my medical decision making.  Assessment & Plan:  Wana was seen today for vaginal itching.  Diagnoses and all orders for this visit:  Vaginal irritation Vaginitis and vulvovaginitis Wet prep negative for yeast but pt recently dosed with diflucan which could skew results. Pt with significant irritation, thick white discharge, and vulva swelling. Will treat with diflucan as below due to symptoms. Testing for other infections pending, will treat if warranted.  -     Urinalysis, Routine w reflex microscopic -     Urine Culture -     WET PREP FOR TRICH, YEAST, CLUE -     Urine cytology ancillary only -     fluconazole (DIFLUCAN) 150 MG  tablet; 1 po q week x 4 weeks  Abscess Unable to tolerate bactrim, will change to doxycycline. Pt aware to complete full course of antibiotics.  -     doxycycline (VIBRA-TABS) 100 MG tablet; Take 1 tablet (100 mg total) by mouth 2 (two) times daily for 10 days. 1 po bid     Continue all other maintenance medications.  Follow up plan: Return if symptoms worsen or fail to improve.   Continue healthy lifestyle choices, including diet (rich in fruits, vegetables, and lean proteins, and low in salt and simple carbohydrates) and exercise (at least 30 minutes of moderate physical activity daily).   The above assessment and management plan was discussed with the patient. The patient verbalized understanding of and has agreed to the management plan. Patient is aware to call the clinic if they develop any new symptoms or if symptoms persist or worsen. Patient is aware when to return to the clinic for a follow-up visit. Patient educated on when it is appropriate to go to the emergency department.   Kari Baars, FNP-C [facility] [phone]

## 2022-06-07 LAB — URINE CULTURE

## 2022-06-09 ENCOUNTER — Other Ambulatory Visit: Payer: Self-pay | Admitting: Nurse Practitioner

## 2022-06-09 ENCOUNTER — Other Ambulatory Visit: Payer: Self-pay

## 2022-06-09 ENCOUNTER — Telehealth: Payer: Self-pay | Admitting: Nurse Practitioner

## 2022-06-09 NOTE — Telephone Encounter (Signed)
I advised pt of provider feedback and she states she already has a box of wegovy 1.7mg  from pharmacy but it had expired as it was filled out of order so she just wants a refill sent in. I told pt I would send a message to PCP and call her back once it was addressed.

## 2022-06-09 NOTE — Telephone Encounter (Signed)
  Prescription Request  06/09/2022  Is this a "Controlled Substance" medicine? no  Have you seen your PCP in the last 2 weeks? no  If YES, route message to pool  -  If NO, patient needs to be scheduled for appointment.  What is the name of the medication or equipment? Wegovy 1.7 mg  Have you contacted your pharmacy to request a refill? yes   Which pharmacy would you like this sent to?  [facility]   Patient notified that their request is being sent to the clinical staff for review and that they should receive a response within 2 business days.

## 2022-06-09 NOTE — Telephone Encounter (Signed)
Please have patient schedule an appointment if she has not been seen in the last 3 months to reassess need for weight loss management and labs.

## 2022-06-10 ENCOUNTER — Other Ambulatory Visit: Payer: Self-pay | Admitting: Nurse Practitioner

## 2022-06-10 DIAGNOSIS — Z6837 Body mass index (BMI) 37.0-37.9, adult: Secondary | ICD-10-CM

## 2022-06-10 MED ORDER — WEGOVY 1.7 MG/0.75ML ~~LOC~~ SOAJ: 1.7000 mg | SUBCUTANEOUS | 0 refills | Status: AC

## 2022-06-10 NOTE — Telephone Encounter (Signed)
Medication sent to pharmcy

## 2022-06-11 LAB — URINE CYTOLOGY ANCILLARY ONLY
Bacterial Vaginitis-Urine: NEGATIVE
Candida Urine: NEGATIVE
Chlamydia: NEGATIVE
Comment: NEGATIVE
Comment: NEGATIVE
Comment: NORMAL
Neisseria Gonorrhea: NEGATIVE
Trichomonas: NEGATIVE

## 2022-06-19 ENCOUNTER — Other Ambulatory Visit: Payer: Self-pay

## 2022-06-22 ENCOUNTER — Telehealth: Payer: Self-pay

## 2022-06-22 NOTE — Telephone Encounter (Signed)
PA has been submitted through cover my meds   Wylee Ogden (Key: [id number])

## 2022-06-24 NOTE — Telephone Encounter (Signed)
Mancel Parsons DENIED  Pt must have recent documentation of diet and exercise plan with OV notes to be sent over.

## 2022-06-26 NOTE — Telephone Encounter (Signed)
Please schedule patient for a phone visit so I can reassess patients current diet plan and exeresis. Reevaluate weight goals and see how best I can help patient with her weight loss journey. Thanks

## 2022-06-26 NOTE — Telephone Encounter (Signed)
Left message to return call 

## 2022-07-21 ENCOUNTER — Telehealth: Payer: Self-pay | Admitting: *Deleted

## 2022-07-21 ENCOUNTER — Other Ambulatory Visit: Payer: Self-pay | Admitting: *Deleted

## 2022-07-21 ENCOUNTER — Telehealth: Payer: Self-pay | Admitting: Nurse Practitioner

## 2022-07-21 DIAGNOSIS — F3281 Premenstrual dysphoric disorder: Secondary | ICD-10-CM | POA: Diagnosis not present

## 2022-07-21 DIAGNOSIS — F339 Major depressive disorder, recurrent, unspecified: Secondary | ICD-10-CM | POA: Diagnosis not present

## 2022-07-21 DIAGNOSIS — F411 Generalized anxiety disorder: Secondary | ICD-10-CM | POA: Diagnosis not present

## 2022-07-21 NOTE — Telephone Encounter (Signed)
Lab orders placed for upcoming visit.

## 2022-07-21 NOTE — Telephone Encounter (Signed)
done

## 2022-07-23 DIAGNOSIS — Z113 Encounter for screening for infections with a predominantly sexual mode of transmission: Secondary | ICD-10-CM | POA: Diagnosis not present

## 2022-07-23 DIAGNOSIS — Z202 Contact with and (suspected) exposure to infections with a predominantly sexual mode of transmission: Secondary | ICD-10-CM | POA: Diagnosis not present

## 2022-07-23 DIAGNOSIS — Z1389 Encounter for screening for other disorder: Secondary | ICD-10-CM | POA: Diagnosis not present

## 2022-07-23 DIAGNOSIS — Z3040 Encounter for surveillance of contraceptives, unspecified: Secondary | ICD-10-CM | POA: Diagnosis not present

## 2022-07-23 DIAGNOSIS — Z130 Encounter for screening for diseases of the blood and blood-forming organs and certain disorders involving the immune mechanism: Secondary | ICD-10-CM | POA: Diagnosis not present

## 2022-07-23 DIAGNOSIS — Z01419 Encounter for gynecological examination (general) (routine) without abnormal findings: Secondary | ICD-10-CM | POA: Diagnosis not present

## 2022-07-23 DIAGNOSIS — F3281 Premenstrual dysphoric disorder: Secondary | ICD-10-CM | POA: Diagnosis not present

## 2022-08-03 ENCOUNTER — Other Ambulatory Visit: Payer: Self-pay

## 2022-08-17 ENCOUNTER — Other Ambulatory Visit: Payer: Self-pay

## 2022-08-17 MED ORDER — FLUOXETINE HCL 20 MG PO CAPS
20.0000 mg | ORAL_CAPSULE | Freq: Every day | ORAL | 0 refills | Status: DC
  Filled 2022-08-17: qty 30, 30d supply, fill #0

## 2022-08-18 ENCOUNTER — Other Ambulatory Visit: Payer: Self-pay

## 2022-08-20 ENCOUNTER — Other Ambulatory Visit: Payer: Self-pay

## 2022-08-26 ENCOUNTER — Other Ambulatory Visit: Payer: Self-pay

## 2022-09-02 ENCOUNTER — Other Ambulatory Visit (HOSPITAL_COMMUNITY): Payer: Self-pay

## 2022-09-02 ENCOUNTER — Telehealth: Payer: Self-pay

## 2022-09-02 NOTE — Telephone Encounter (Signed)
Pharmacy Patient Advocate Encounter   Received notification from [facility] that prior authorization for [facility] 1.7MG /0.75ML auto-injectors is required/requested.  Per Test Claim: Reginal Lutes is covered under primary insurance.    Pharmacy was trying to bill out of state medicaid. Called pharmacy and gave primary insurance info. Pharmacy was able to process through insurance.

## 2022-09-14 DIAGNOSIS — F339 Major depressive disorder, recurrent, unspecified: Secondary | ICD-10-CM | POA: Diagnosis not present

## 2022-09-14 DIAGNOSIS — Z0001 Encounter for general adult medical examination with abnormal findings: Secondary | ICD-10-CM | POA: Diagnosis not present

## 2022-09-14 DIAGNOSIS — F411 Generalized anxiety disorder: Secondary | ICD-10-CM | POA: Diagnosis not present

## 2022-11-09 DIAGNOSIS — J101 Influenza due to other identified influenza virus with other respiratory manifestations: Secondary | ICD-10-CM | POA: Diagnosis not present

## 2022-11-09 DIAGNOSIS — J358 Other chronic diseases of tonsils and adenoids: Secondary | ICD-10-CM | POA: Diagnosis not present

## 2022-11-09 DIAGNOSIS — J069 Acute upper respiratory infection, unspecified: Secondary | ICD-10-CM | POA: Diagnosis not present

## 2023-01-14 DIAGNOSIS — F411 Generalized anxiety disorder: Secondary | ICD-10-CM | POA: Diagnosis not present

## 2023-01-14 DIAGNOSIS — F3281 Premenstrual dysphoric disorder: Secondary | ICD-10-CM | POA: Diagnosis not present

## 2023-01-18 ENCOUNTER — Other Ambulatory Visit: Payer: Self-pay

## 2023-01-18 MED ORDER — FLUOXETINE HCL 20 MG PO CAPS
20.0000 mg | ORAL_CAPSULE | Freq: Every day | ORAL | 0 refills | Status: DC
  Filled 2023-01-18: qty 30, 90d supply, fill #0

## 2023-01-18 MED ORDER — FLUOXETINE HCL 20 MG PO TABS
20.0000 mg | ORAL_TABLET | Freq: Every day | ORAL | 1 refills | Status: DC
Start: 2023-01-14 — End: 2023-01-18
  Filled 2023-01-18: qty 30, 30d supply, fill #0

## 2023-01-18 MED ORDER — FLUOXETINE HCL 40 MG PO CAPS
40.0000 mg | ORAL_CAPSULE | Freq: Every day | ORAL | 6 refills | Status: DC
  Filled 2023-01-18: qty 90, 90d supply, fill #0

## 2023-01-18 MED ORDER — FLUOXETINE HCL 40 MG PO CAPS
40.0000 mg | ORAL_CAPSULE | Freq: Every day | ORAL | 1 refills | Status: DC
  Filled 2023-01-18: qty 90, 90d supply, fill #0

## 2023-01-23 ENCOUNTER — Telehealth: Payer: Commercial Managed Care - PPO | Admitting: Nurse Practitioner

## 2023-01-23 DIAGNOSIS — L03019 Cellulitis of unspecified finger: Secondary | ICD-10-CM | POA: Diagnosis not present

## 2023-01-23 MED ORDER — MUPIROCIN 2 % EX OINT
1.0000 | TOPICAL_OINTMENT | Freq: Two times a day (BID) | CUTANEOUS | 0 refills | Status: AC
Start: 2023-01-23 — End: ?

## 2023-01-23 NOTE — Progress Notes (Signed)
I have spent 5 minutes in review of e-visit questionnaire, review and updating patient chart, medical decision making and response to patient.  ° °Estela Vinal W Zanaya Baize, NP ° °  °

## 2023-01-23 NOTE — Progress Notes (Signed)
E Visit for Paronychia  We are sorry that you are not feeling well. Here is how we plan to help!  Based on what you shared with me it looks like you have paronychia which is an infection of the nail bed/cuticle.  It develops as a result of bacteria entering under the skin.  I have prescribed:  Bactroban ointment. If no improvement in 7 days you will need to be evaluated in person to make sure it does not require any draining of the area.  You can alternate with tylenol and motrin for pain.   HOME CARE:  Take your medications as ordered and take all of them, even if the skin irritation appears to be healing.   GET HELP RIGHT AWAY IF:  Symptoms that don't begin to go away within 48 hours. Severe redness persists or worsens If the area turns color, spreads or swells. If it blisters and opens, develops yellow-brown crust or bleeds. You develop a fever or chills. If the pain increases or becomes unbearable.  Are unable to keep fluids and food down.  MAKE SURE YOU   Understand these instructions. Will watch your condition. Will get help right away if you are not doing well or get worse.  Thank you for choosing an e-visit.  Your e-visit answers were reviewed by a board certified advanced clinical practitioner to complete your personal care plan. Depending upon the condition, your plan could have included both over the counter or prescription medications.  Please review your pharmacy choice. Make sure the pharmacy is open so you can pick up prescription now. If there is a problem, you may contact your provider through [employer] and have the prescription routed to another pharmacy.  Your safety is important to [place]. If you have drug allergies check your prescription carefully.   For the next 24 hours you can use MyChart to ask questions about today's visit, request a non-urgent call back, or ask for a work or school excuse. You will get an email in the next two days asking about your  experience. I hope that your e-visit has been valuable and will speed your recovery.

## 2023-01-30 ENCOUNTER — Ambulatory Visit
Admission: EM | Admit: 2023-01-30 | Discharge: 2023-01-30 | Disposition: A | Discharge status: Home / Self Care | Payer: Commercial Managed Care - PPO | Attending: Nurse Practitioner | Admitting: Nurse Practitioner

## 2023-01-30 DIAGNOSIS — J029 Acute pharyngitis, unspecified: Secondary | ICD-10-CM | POA: Insufficient documentation

## 2023-01-30 LAB — POCT RAPID STREP A (OFFICE): Rapid Strep A Screen: NEGATIVE

## 2023-01-30 MED ORDER — LIDOCAINE VISCOUS HCL 2 % MT SOLN: 5.0000 mL | OROMUCOSAL | 0 refills | Status: AC | PRN

## 2023-01-30 NOTE — ED Provider Notes (Addendum)
[facility]    CSN: [id number] Arrival date & time: 01/30/23  0801      History   Chief Complaint No chief complaint on file.   HPI Lindsay Sanders is a 32 y.o. female.   The history is provided by the patient.   The patient presents for complaints of sore throat and right ear pain x 1 day.  Patient states when she woke up this morning, she saw "white spots" on her throat.Patient denies fever, chills, headache, ear drainage, nasal congestion, runny nose, cough, chest pain, abdominal pain, nausea, vomiting, or diarrhea.  Patient reports she has been taking ibuprofen and Tylenol for her pain with minimal relief.  She denies any obvious known sick contacts.    Past Medical History:  Diagnosis Date   Anxiety    Depression    Hypertension     Patient Active Problem List   Diagnosis Date Noted   Depression, recurrent (HCC) 02/24/2022   Body mass index (BMI) 37.0-37.9, adult 12/30/2021   Depression, major, single episode, moderate (HCC) 10/29/2021   Anxiety 10/29/2021   Establishing care with new doctor, encounter for 10/29/2021   Cellulitis 02/03/2021   Mixed anxiety and depressive disorder 03/31/2018   Surveillance of previously prescribed contraceptive pill 06/03/2006    Past Surgical History:  Procedure Laterality Date   CESAREAN SECTION  2009   WISDOM TOOTH EXTRACTION  2011    OB History   No obstetric history on file.      Home Medications    Prior to Admission medications   Medication Sig Start Date End Date Taking? Authorizing Provider  lidocaine (XYLOCAINE) 2 % solution Use as directed 5 mLs in the mouth or throat as needed for mouth pain. Gargle and spit 5mL every 6 hours as needed for throat pain. 01/30/23  Yes Elwin Tsou-Warren, Sadie Haber, NP  desvenlafaxine (PRISTIQ) 50 MG 24 hr tablet Take 1 tablet by mouth once daily Patient not taking: Reported on 06/05/2022 05/18/22   Daryll Drown, NP  fluconazole (DIFLUCAN) 150 MG tablet 1 po q week x 4  weeks 06/05/22   Sonny Masters, FNP  FLUoxetine (PROZAC) 20 MG capsule Take 1 capsule (20 mg total) by mouth daily 7 to 10 days prior to menses then return to 40 mg. 01/14/23     FLUoxetine (PROZAC) 40 MG capsule Take 1 capsule (40 mg total) by mouth daily for depression and anxiety. 12/17/22     mupirocin ointment (BACTROBAN) 2 % Apply 1 Application topically 2 (two) times daily. Apply for 7 days 01/23/23   Claiborne Rigg, NP  Semaglutide-Weight Management (WEGOVY) 1.7 MG/0.75ML SOAJ Inject 1.7 mg into the skin every 7 (seven) days. 06/10/22   Daryll Drown, NP    Family History Family History  Problem Relation Age of Onset   Hypertension Mother    ADD / ADHD Son    Hypertension Maternal Grandmother    Arthritis Maternal Grandmother    Diabetes Paternal Grandfather     Social History Social History   Tobacco Use   Smoking status: Never   Smokeless tobacco: Never  Vaping Use   Vaping Use: Never used  Substance Use Topics   Alcohol use: Yes    Alcohol/week: 7.0 standard drinks of alcohol    Types: 4 Cans of beer, 3 Shots of liquor per week   Drug use: Never     Allergies   Lexapro [escitalopram]   Review of Systems Review of Systems Per HPI  Physical  Exam Triage Vital Signs ED Triage Vitals  Enc Vitals Group     BP 01/30/23 0806 139/89     Pulse Rate 01/30/23 0806 91     Resp 01/30/23 0806 15     Temp 01/30/23 0806 97.8 F (36.6 C)     Temp Source 01/30/23 0806 Oral     SpO2 01/30/23 0806 98 %     Weight --      Height --      Head Circumference --      Peak Flow --      Pain Score 01/30/23 0809 8     Pain Loc --      Pain Edu? --      Excl. in GC? --    No data found.  Updated Vital Signs BP 139/89 (BP Location: Right Arm)   Pulse 91   Temp 97.8 F (36.6 C) (Oral)   Resp 15   LMP 12/23/2022 (Within Days)   SpO2 98%   Visual Acuity Right Eye Distance:   Left Eye Distance:   Bilateral Distance:    Right Eye Near:   Left Eye Near:     Bilateral Near:     Physical Exam Vitals and nursing note reviewed.  Constitutional:      General: She is not in acute distress.    Appearance: Normal appearance.  HENT:     Head: Normocephalic.     Right Ear: Tympanic membrane, ear canal and external ear normal.     Left Ear: Tympanic membrane, ear canal and external ear normal.     Nose: Nose normal.     Mouth/Throat:     Lips: Pink.     Mouth: Mucous membranes are moist.     Pharynx: Uvula midline. Pharyngeal swelling and posterior oropharyngeal erythema present. No oropharyngeal exudate or uvula swelling.     Tonsils: No tonsillar exudate. 1+ on the right. 1+ on the left.  Eyes:     Extraocular Movements: Extraocular movements intact.     Conjunctiva/sclera: Conjunctivae normal.     Pupils: Pupils are equal, round, and reactive to light.  Cardiovascular:     Rate and Rhythm: Normal rate and regular rhythm.     Pulses: Normal pulses.     Heart sounds: Normal heart sounds.  Pulmonary:     Effort: Pulmonary effort is normal. No respiratory distress.     Breath sounds: Normal breath sounds. No stridor. No wheezing, rhonchi or rales.  Abdominal:     General: Bowel sounds are normal.     Palpations: Abdomen is soft.  Musculoskeletal:     Cervical back: Normal range of motion.  Lymphadenopathy:     Cervical: No cervical adenopathy.  Skin:    General: Skin is warm and dry.  Neurological:     General: No focal deficit present.     Mental Status: She is alert and oriented to person, place, and time.  Psychiatric:        Mood and Affect: Mood normal.        Behavior: Behavior normal.      UC Treatments / Results  Labs (all labs ordered are listed, but only abnormal results are displayed) Labs Reviewed  CULTURE, GROUP A STREP [facility])  POCT RAPID STREP A (OFFICE)    EKG   Radiology No results found.  Procedures Procedures (including critical care time)  Medications Ordered in UC Medications - No data to  display  Initial Impression / Assessment and Plan /  UC Course  I have reviewed the triage vital signs and the nursing notes.  Pertinent labs & imaging results that were available during my care of the patient were reviewed by me and considered in my medical decision making (see chart for details).  The patient is well-appearing, she is in no acute distress, vital signs are stable.  Rapid strep test is negative, throat culture is pending.  Will treat for viral pharyngitis pending the throat culture result.  Patient was prescribed viscous lidocaine 2% to gargle and spit 5 mL every 6 hours as needed for throat pain or discomfort.  Supportive care recommendations were provided and discussed with the patient to include continuing over-the-counter analgesics for pain or discomfort, warm salt water gargles, and recommending a soft diet.  Patient is in agreement with this plan of care and verbalizes understanding.  All questions were answered.  Patient stable for discharge.  Final Clinical Impressions(s) / UC Diagnoses   Final diagnoses:  Acute pharyngitis, unspecified etiology     Discharge Instructions      The rapid strep test was negative.  A throat culture is pending.  You will be contacted if the throat culture result is positive to provide treatment. Take medication as prescribed. May continue ibuprofen as needed for pain or discomfort.  Recommend taking 3 to 4 tablets (600 to 800 mg) every 8 hours as needed for throat pain or discomfort. Warm salt water gargles 3-4 times daily while symptoms persist. Recommend a soft diet such as soup, broth, yogurt, pudding, Jell-O, soft vegetable, or soft meat while symptoms persist. If symptoms do not improve over the next 7 to 10 days, recommend following up in this clinic or with your primary care physician for further evaluation. Follow-up as needed.     ED Prescriptions     Medication Sig Dispense Auth. Provider   lidocaine (XYLOCAINE) 2 %  solution Use as directed 5 mLs in the mouth or throat as needed for mouth pain. Gargle and spit 5mL every 6 hours as needed for throat pain. 75 mL Pamlea Finder-Warren, Sadie Haber, NP      PDMP not reviewed this encounter.   Abran Cantor, NP 01/30/23 0826    Abran Cantor, NP 01/30/23 [phone]

## 2023-01-30 NOTE — ED Triage Notes (Signed)
Pt c/o sore throat and right ear pain pt states she has white spots on her tonsils, assumed it was allergies, noticed the spots this morning, sx's x 1 day    Tylenol and ibuprofen has not helped with the pain.

## 2023-01-30 NOTE — Discharge Instructions (Addendum)
The rapid strep test was negative.  A throat culture is pending.  You will be contacted if the throat culture result is positive to provide treatment. Take medication as prescribed. May continue ibuprofen as needed for pain or discomfort.  Recommend taking 3 to 4 tablets (600 to 800 mg) every 8 hours as needed for throat pain or discomfort. Warm salt water gargles 3-4 times daily while symptoms persist. Recommend a soft diet such as soup, broth, yogurt, pudding, Jell-O, soft vegetable, or soft meat while symptoms persist. If symptoms do not improve over the next 7 to 10 days, recommend following up in this clinic or with your primary care physician for further evaluation. Follow-up as needed.

## 2023-01-31 LAB — CULTURE, GROUP A STREP (THRC)

## 2023-02-01 LAB — CULTURE, GROUP A STREP (THRC)

## 2023-02-02 ENCOUNTER — Telehealth (HOSPITAL_COMMUNITY): Payer: Self-pay | Admitting: Emergency Medicine

## 2023-02-02 MED ORDER — AMOXICILLIN 500 MG PO CAPS: 500.0000 mg | ORAL_CAPSULE | Freq: Two times a day (BID) | ORAL | 0 refills | Status: AC

## 2023-02-05 ENCOUNTER — Telehealth: Payer: Self-pay | Admitting: Physician Assistant

## 2023-02-05 MED ORDER — FLUCONAZOLE 150 MG PO TABS: 150.0000 mg | ORAL_TABLET | Freq: Once | ORAL | 0 refills | Status: AC

## 2023-02-05 NOTE — Telephone Encounter (Signed)
Patient was seen by our clinic on 01/30/2023.  Throat culture did grow strep and she was started on amoxicillin.  She returned call today indicating that she has developed vaginal yeast infection symptoms.  Given her recent antibiotics prescribed by our clinic we will send in 1 dose of Diflucan.  This was sent to the pharmacy on file.  If her symptoms do not resolve or if anything changes she would need to be reevaluated.

## 2023-05-18 ENCOUNTER — Other Ambulatory Visit: Payer: Self-pay | Admitting: Oncology

## 2023-05-18 DIAGNOSIS — Z006 Encounter for examination for normal comparison and control in clinical research program: Secondary | ICD-10-CM

## 2023-08-26 DIAGNOSIS — Z01419 Encounter for gynecological examination (general) (routine) without abnormal findings: Secondary | ICD-10-CM | POA: Diagnosis not present

## 2023-08-26 DIAGNOSIS — Z202 Contact with and (suspected) exposure to infections with a predominantly sexual mode of transmission: Secondary | ICD-10-CM | POA: Diagnosis not present

## 2023-08-26 DIAGNOSIS — Z1389 Encounter for screening for other disorder: Secondary | ICD-10-CM | POA: Diagnosis not present

## 2023-08-26 DIAGNOSIS — Z713 Dietary counseling and surveillance: Secondary | ICD-10-CM | POA: Diagnosis not present

## 2023-08-26 DIAGNOSIS — Z113 Encounter for screening for infections with a predominantly sexual mode of transmission: Secondary | ICD-10-CM | POA: Diagnosis not present

## 2023-08-26 DIAGNOSIS — Z3041 Encounter for surveillance of contraceptive pills: Secondary | ICD-10-CM | POA: Diagnosis not present

## 2023-08-26 DIAGNOSIS — Z3009 Encounter for other general counseling and advice on contraception: Secondary | ICD-10-CM | POA: Diagnosis not present

## 2024-01-17 ENCOUNTER — Other Ambulatory Visit: Payer: Self-pay | Admitting: Obstetrics

## 2024-01-17 DIAGNOSIS — N939 Abnormal uterine and vaginal bleeding, unspecified: Secondary | ICD-10-CM

## 2024-01-17 MED ORDER — MEDROXYPROGESTERONE ACETATE 10 MG PO TABS
20.0000 mg | ORAL_TABLET | Freq: Every day | ORAL | 2 refills | Status: DC
Start: 2024-01-17 — End: 2024-05-30

## 2024-01-17 NOTE — Progress Notes (Signed)
 AUB while taking Yaz oral contraceptives. Bleeding for 3 weeks continuously.  Calton Catholic CNM.

## 2024-05-30 ENCOUNTER — Telehealth: Admitting: Physician Assistant

## 2024-05-30 DIAGNOSIS — R3989 Other symptoms and signs involving the genitourinary system: Secondary | ICD-10-CM

## 2024-05-30 MED ORDER — FLUCONAZOLE 150 MG PO TABS: ORAL_TABLET | ORAL | 0 refills | Status: AC

## 2024-05-30 MED ORDER — CEPHALEXIN 500 MG PO CAPS: 500.0000 mg | ORAL_CAPSULE | Freq: Two times a day (BID) | ORAL | 0 refills | Status: AC

## 2024-05-30 NOTE — Progress Notes (Signed)
 E-Visit for Urinary Problems  We are sorry that you are not feeling well.  Here is how we plan to help!  Based on what you shared with me it looks like you most likely have a simple urinary tract infection.  A UTI (Urinary Tract Infection) is a bacterial infection of the bladder.  Most cases of urinary tract infections are simple to treat but a key part of your care is to encourage you to drink plenty of fluids and watch your symptoms carefully.  I have prescribed Keflex  500 mg twice a day for 7 days.  Your symptoms should gradually improve. I have also sent in a course of Diflucan  in case of yeast from antibiotic-use. Call us  if the burning in your urine worsens, you develop worsening fever, back pain or pelvic pain or if your symptoms do not resolve after completing the antibiotic.  Urinary tract infections can be prevented by drinking plenty of water to keep your body hydrated.  Also be sure when you wipe, wipe from front to back and don't hold it in!  If possible, empty your bladder every 4 hours.  HOME CARE Drink plenty of fluids Compete the full course of the antibiotics even if the symptoms resolve Remember, when you need to go.go. Holding in your urine can increase the likelihood of getting a UTI! GET HELP RIGHT AWAY IF: You cannot urinate You get a high fever Worsening back pain occurs You see blood in your urine You feel sick to your stomach or throw up You feel like you are going to pass out  MAKE SURE YOU  Understand these instructions. Will watch your condition. Will get help right away if you are not doing well or get worse.   Thank you for choosing an e-visit.  Your e-visit answers were reviewed by a board certified advanced clinical practitioner to complete your personal care plan. Depending upon the condition, your plan could have included both over the counter or prescription medications.  Please review your pharmacy choice. Make sure the pharmacy is open so you  can pick up prescription now. If there is a problem, you may contact your provider through [employer] and have the prescription routed to another pharmacy.  Your safety is important to us . If you have drug allergies check your prescription carefully.   For the next 24 hours you can use MyChart to ask questions about today's visit, request a non-urgent call back, or ask for a work or school excuse. You will get an email in the next two days asking about your experience. I hope that your e-visit has been valuable and will speed your recovery.

## 2024-05-30 NOTE — Progress Notes (Signed)
 I have spent 5 minutes in review of e-visit questionnaire, review and updating patient chart, medical decision making and response to patient.   Elsie Velma Lunger, PA-C

## 2024-06-29 DIAGNOSIS — Z0000 Encounter for general adult medical examination without abnormal findings: Secondary | ICD-10-CM | POA: Diagnosis not present

## 2024-06-29 DIAGNOSIS — F339 Major depressive disorder, recurrent, unspecified: Secondary | ICD-10-CM | POA: Diagnosis not present

## 2024-07-11 DIAGNOSIS — N760 Acute vaginitis: Secondary | ICD-10-CM | POA: Diagnosis not present

## 2024-07-11 DIAGNOSIS — R300 Dysuria: Secondary | ICD-10-CM | POA: Diagnosis not present

## 2024-07-11 DIAGNOSIS — Z3202 Encounter for pregnancy test, result negative: Secondary | ICD-10-CM | POA: Diagnosis not present

## 2024-07-11 DIAGNOSIS — B9689 Other specified bacterial agents as the cause of diseases classified elsewhere: Secondary | ICD-10-CM | POA: Diagnosis not present

## 2024-07-11 DIAGNOSIS — R102 Pelvic and perineal pain unspecified side: Secondary | ICD-10-CM | POA: Diagnosis not present

## 2024-07-11 DIAGNOSIS — N898 Other specified noninflammatory disorders of vagina: Secondary | ICD-10-CM | POA: Diagnosis not present

## 2024-07-13 DIAGNOSIS — R102 Pelvic and perineal pain unspecified side: Secondary | ICD-10-CM | POA: Diagnosis not present

## 2024-07-21 ENCOUNTER — Other Ambulatory Visit: Payer: Self-pay | Admitting: Medical Genetics

## 2024-07-21 DIAGNOSIS — Z006 Encounter for examination for normal comparison and control in clinical research program: Secondary | ICD-10-CM

## 2024-08-18 NOTE — Telephone Encounter (Signed)
 SABRA

## 2024-08-22 LAB — GENECONNECT MOLECULAR SCREEN: Genetic Analysis Overall Interpretation: NEGATIVE

## 2024-08-30 DIAGNOSIS — Z3009 Encounter for other general counseling and advice on contraception: Secondary | ICD-10-CM | POA: Diagnosis not present

## 2024-08-30 DIAGNOSIS — Z3041 Encounter for surveillance of contraceptive pills: Secondary | ICD-10-CM | POA: Diagnosis not present

## 2024-08-30 DIAGNOSIS — Z1389 Encounter for screening for other disorder: Secondary | ICD-10-CM | POA: Diagnosis not present

## 2024-08-30 DIAGNOSIS — Z113 Encounter for screening for infections with a predominantly sexual mode of transmission: Secondary | ICD-10-CM | POA: Diagnosis not present

## 2024-08-30 DIAGNOSIS — Z130 Encounter for screening for diseases of the blood and blood-forming organs and certain disorders involving the immune mechanism: Secondary | ICD-10-CM | POA: Diagnosis not present

## 2024-08-30 DIAGNOSIS — Z202 Contact with and (suspected) exposure to infections with a predominantly sexual mode of transmission: Secondary | ICD-10-CM | POA: Diagnosis not present

## 2024-08-30 DIAGNOSIS — Z01419 Encounter for gynecological examination (general) (routine) without abnormal findings: Secondary | ICD-10-CM | POA: Diagnosis not present

## 2024-09-14 ENCOUNTER — Other Ambulatory Visit: Payer: Self-pay

## 2024-09-14 ENCOUNTER — Other Ambulatory Visit (HOSPITAL_COMMUNITY): Payer: Self-pay

## 2024-09-14 MED ORDER — SLYND 4 MG PO TABS
1.0000 | ORAL_TABLET | Freq: Every day | ORAL | 1 refills | Status: AC
  Filled 2024-09-14: qty 84, 84d supply, fill #0

## 2024-11-08 ENCOUNTER — Ambulatory Visit
Admission: RE | Admit: 2024-11-08 | Discharge: 2024-11-08 | Disposition: A | Source: Ambulatory Visit | Attending: Nurse Practitioner | Admitting: Nurse Practitioner

## 2024-11-08 ENCOUNTER — Other Ambulatory Visit: Payer: Self-pay | Admitting: Nurse Practitioner

## 2024-11-08 DIAGNOSIS — M898X1 Other specified disorders of bone, shoulder: Secondary | ICD-10-CM
